# Patient Record
Sex: Female | Born: 1985
Health system: Southern US, Community
[De-identification: ages and names within clinical notes are randomized; demographics above are authoritative.]

## PROBLEM LIST (undated history)

## (undated) DIAGNOSIS — J4520 Mild intermittent asthma, uncomplicated: Secondary | ICD-10-CM

## (undated) DIAGNOSIS — J302 Other seasonal allergic rhinitis: Secondary | ICD-10-CM

## (undated) DIAGNOSIS — N281 Cyst of kidney, acquired: Secondary | ICD-10-CM

## (undated) DIAGNOSIS — Z9889 Other specified postprocedural states: Secondary | ICD-10-CM

## (undated) HISTORY — PX: WISDOM TOOTH EXTRACTION: SHX21

## (undated) HISTORY — DX: Other seasonal allergic rhinitis: J30.2

## (undated) HISTORY — DX: Mild intermittent asthma, uncomplicated: J45.20

---

## 2018-03-21 ENCOUNTER — Ambulatory Visit (INDEPENDENT_AMBULATORY_CARE_PROVIDER_SITE_OTHER): Payer: Self-pay | Admitting: Family Medicine

## 2018-03-21 ENCOUNTER — Ambulatory Visit (INDEPENDENT_AMBULATORY_CARE_PROVIDER_SITE_OTHER): Payer: Self-pay

## 2018-03-21 VITALS — BP 105/70 | HR 84 | Temp 98.4°F | Resp 18 | Ht 63.5 in | Wt 197.0 lb

## 2018-03-21 DIAGNOSIS — Z021 Encounter for pre-employment examination: Secondary | ICD-10-CM

## 2018-03-21 DIAGNOSIS — Z111 Encounter for screening for respiratory tuberculosis: Secondary | ICD-10-CM

## 2018-03-21 NOTE — Patient Instructions (Signed)

## 2018-03-21 NOTE — Progress Notes (Signed)
Danielle Olsen is a 32 y.o. female who presents today with concerns of need for a physical exam for pre-employment. 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 She confirms asthma as her only chronic health condition that is well controlled and she is under the care of a pulmonologist. She is a married mother of a 33 year old daughter.  Review of Systems  Constitutional: Negative for chills, fever and malaise/fatigue.  HENT: Negative for congestion, ear discharge, ear pain, sinus pain and sore throat.   Eyes: Negative.   Respiratory: Negative for cough, sputum production and shortness of breath.   Cardiovascular: Negative.  Negative for chest pain.  Gastrointestinal: Negative for abdominal pain, diarrhea, nausea and vomiting.  Genitourinary: Negative for dysuria, frequency, hematuria and urgency.  Musculoskeletal: Negative for myalgias.  Skin: Negative.   Neurological: Negative for headaches.  Endo/Heme/Allergies: Negative.   Psychiatric/Behavioral: Negative.     O: Vitals:   03/21/18 0834  BP: 105/70  Pulse: 84  Resp: 18  Temp: 98.4 F (36.9 C)  SpO2: 97%     Physical Exam  Constitutional: She is oriented to person, place, and time. Vital signs are normal. She appears well-developed and well-nourished. She is active.  Non-toxic appearance. She does not have a sickly appearance.  HENT:  Head: Normocephalic.  Right Ear: Hearing, tympanic membrane, external ear and ear canal normal.  Left Ear: Hearing, tympanic membrane, external ear and ear canal normal.  Nose: Nose normal.  Mouth/Throat: Uvula is midline and oropharynx is clear and moist.  Neck: Normal range of motion. Neck supple.  Cardiovascular: Normal rate, regular rhythm, normal heart sounds and normal pulses.  Pulmonary/Chest: Effort normal and breath sounds normal.  Abdominal: Soft. Bowel sounds are normal.  Musculoskeletal: Normal range of motion.  Lymphadenopathy:       Head  (right side): No submental and no submandibular adenopathy present.       Head (left side): No submental and no submandibular adenopathy present.    She has no cervical adenopathy.  Neurological: She is alert and oriented to person, place, and time.  Psychiatric: She has a normal mood and affect.  Vitals reviewed.   A: 1. Physical exam, pre-employment     P: Discussed exam findings, diagnosis etiology and medication use and indications reviewed with patient. Follow- Up and discharge instructions provided. No emergent/urgent issues found on exam.  Patient verbalized understanding of information provided and agrees with plan of care (POC), all questions answered.  1. Physical exam, pre-employment No acute findings- Eye exam in the last year. Exam WNL

## 2018-03-21 NOTE — Progress Notes (Signed)
Patient presents for PPD placement for work. Denies previous positive TB test  Denies known exposure to TB   Tuberculin skin test applied to left ventral forearm.  Patient informed to return to [facility] in 48-72 hours for PPD read. Vaccine Information Statement provided to patient.   

## 2018-03-23 LAB — TB SKIN TEST
Induration: 0 mm
TB SKIN TEST: NEGATIVE

## 2018-04-25 MED FILL — MONTELUKAST SOD 10 MG TAB: 10 | 30 days supply | Qty: 30 | Fill #0

## 2018-05-04 MED FILL — ARNUITY ELLIPTA 100 MCG INH: 100 | 60 days supply | Qty: 60 | Fill #0

## 2018-05-25 MED FILL — MONTELUKAST SOD 10 MG TAB: 10 | 30 days supply | Qty: 30 | Fill #1

## 2018-06-01 ENCOUNTER — Ambulatory Visit (INDEPENDENT_AMBULATORY_CARE_PROVIDER_SITE_OTHER): Payer: Self-pay | Admitting: Nurse Practitioner

## 2018-06-01 DIAGNOSIS — Z23 Encounter for immunization: Secondary | ICD-10-CM

## 2018-06-01 NOTE — Progress Notes (Signed)
Pt presents here today for visit to receive Inflenza(left deltoid) vaccine. Allergies reviewed, vaccine given, vaccine information statement provided, tolerated well.

## 2018-06-21 MED FILL — MONTELUKAST SOD 10 MG TAB: 10 | 30 days supply | Qty: 30 | Fill #2

## 2018-07-21 MED FILL — MONTELUKAST SOD 10 MG TAB: 10 | 30 days supply | Qty: 30 | Fill #3

## 2018-09-05 DIAGNOSIS — Z1389 Encounter for screening for other disorder: Secondary | ICD-10-CM | POA: Diagnosis not present

## 2018-09-05 DIAGNOSIS — Z124 Encounter for screening for malignant neoplasm of cervix: Secondary | ICD-10-CM | POA: Diagnosis not present

## 2018-09-05 DIAGNOSIS — Z6834 Body mass index (BMI) 34.0-34.9, adult: Secondary | ICD-10-CM | POA: Diagnosis not present

## 2018-09-05 DIAGNOSIS — R87610 Atypical squamous cells of undetermined significance on cytologic smear of cervix (ASC-US): Secondary | ICD-10-CM | POA: Diagnosis not present

## 2018-09-05 DIAGNOSIS — Z130 Encounter for screening for diseases of the blood and blood-forming organs and certain disorders involving the immune mechanism: Secondary | ICD-10-CM | POA: Diagnosis not present

## 2018-09-05 DIAGNOSIS — Z01419 Encounter for gynecological examination (general) (routine) without abnormal findings: Secondary | ICD-10-CM | POA: Diagnosis not present

## 2018-10-21 MED FILL — MONTELUKAST SOD 10 MG TAB: 10 | 30 days supply | Qty: 30 | Fill #4

## 2018-11-09 MED FILL — MONTELUKAST SOD 10 MG TAB: 10 | 90 days supply | Qty: 90 | Fill #5

## 2019-02-06 MED FILL — MONTELUKAST SOD 10 MG TAB: 10 | 90 days supply | Qty: 90 | Fill #6

## 2019-03-16 DIAGNOSIS — Z319 Encounter for procreative management, unspecified: Secondary | ICD-10-CM | POA: Diagnosis not present

## 2019-04-07 DIAGNOSIS — N979 Female infertility, unspecified: Secondary | ICD-10-CM | POA: Diagnosis not present

## 2019-04-07 DIAGNOSIS — Z6833 Body mass index (BMI) 33.0-33.9, adult: Secondary | ICD-10-CM | POA: Diagnosis not present

## 2019-05-10 MED FILL — MONTELUKAST SOD 10 MG TAB: 10 | 90 days supply | Qty: 90 | Fill #0

## 2019-05-16 ENCOUNTER — Encounter: Payer: Self-pay | Admitting: Internal Medicine

## 2019-05-16 NOTE — Patient Instructions (Addendum)
Flu shot Singulair, allegra F/u in 1 year or sooner if feeling unwell

## 2019-05-16 NOTE — Progress Notes (Signed)
Synopsis: self referral mild intermittent asthma  Subjective:   PATIENT ID: Danielle Olsen GENDER: female DOB: 1986/03/10, MRN: [id number]  Chief Complaint  Patient presents with  . Consult    Consult for Asthma.     HPI 33 year old woman here to establish care for her mild intermittent asthma. Home regimen includes singulair and PRN pulmicort Highly active, multiple aerobic workouts per week Hospitalized once in third grade No recent ER visits/hospitalizations Triggered by hot to cold seasonal transitions No flu shot yet Patient in first trimester, last pregnancy asthma control improved while gravid  Past Medical History:  Diagnosis Date  . Mild intermittent asthma   . Seasonal allergies      Family History  Problem Relation Age of Onset  . Lung cancer Father   . COPD Mother        Social History   Socioeconomic History  . Marital status: Married    Spouse name: Leory Plowman  . Number of children: 1  . Years of education: Not on file  . Highest education level: Master's degree (e.g., MA, MS, MEng, MEd, MSW, MBA)  Occupational History  . Not on file  Social Needs  . Financial resource strain: Not on file  . Food insecurity    Worry: Not on file    Inability: Not on file  . Transportation needs    Medical: No    Non-medical: No  Tobacco Use  . Smoking status: Never Smoker  . Smokeless tobacco: Never Used  Substance and Sexual Activity  . Alcohol use: Not Currently  . Drug use: Never  . Sexual activity: Not on file  Lifestyle  . Physical activity    Days per week: 7 days    Minutes per session: 40 min  . Stress: Not on file  Relationships  . Social Herbalist on phone: Not on file    Gets together: Not on file    Attends religious service: Not on file    Active member of club or organization: Not on file    Attends meetings of clubs or organizations: Not on file    Relationship status: Married  . Intimate partner violence    Fear of  current or ex partner: Not on file    Emotionally abused: Not on file    Physically abused: Not on file    Forced sexual activity: Not on file  Other Topics Concern  . Not on file  Social History Narrative  . Not on file     Allergies  Allergen Reactions  . Penicillins      Outpatient Medications Prior to Visit  Medication Sig Dispense Refill  . budesonide (PULMICORT) 0.25 MG/2ML nebulizer solution Take 0.25 mg by nebulization as needed.     . fexofenadine (ALLEGRA) 30 MG/5ML suspension Take 30 mg by mouth daily.    . montelukast (SINGULAIR) 10 MG tablet Take 10 mg by mouth at bedtime.     No facility-administered medications prior to visit.      Positive Symptoms in bold:  Constitutional fevers, chills, weight loss, fatigue, anorexia, malaise  Eyes decreased vision, double vision, eye irritation  Ears, Nose, Mouth, Throat sore throat, trouble swallowing, sinus congestion  Cardiovascular chest pain, paroxysmal nocturnal dyspnea, lower ext edema, palpitations   Respiratory SOB, cough, DOE, hemoptysis, wheezing  Gastrointestinal nausea, vomiting, diarrhea  Genitourinary burning with urination, trouble urinating  Musculoskeletal joint aches, joint swelling, back pain  Integumentary  rashes, skin lesions  Neurological focal  weakness, focal numbness, trouble speaking, headaches  Psychiatric depression, anxiety, confusion  Endocrine polyuria, polydipsia, cold intolerance, heat intolerance  Hematologic abnormal bruising, abnormal bleeding, unexplained nose bleeds  Allergic/Immunologic recurrent infections, hives, swollen lymph nodes    Objective:  GEN: young woman in NAD HEENT: mask in place, trachea midline CV: RRR, ext warm PULM: clear, no accessory muscle use GI: Soft, +BS EXT: No edema NEURO: Ambulates normally PSYCH: AOx3, good insight SKIN: No rashes    Vitals:   05/19/19 0914  BP: 122/84  Pulse: 82  Temp: 98.7 F (37.1 C)  TempSrc: Temporal  SpO2: 100%   Weight: 193 lb 6.4 oz (87.7 kg)  Height: 5\' 3"  (1.6 m)   100% on RA BMI Readings from Last 3 Encounters:  05/19/19 34.26 kg/m  03/21/18 34.35 kg/m   Wt Readings from Last 3 Encounters:  05/19/19 193 lb 6.4 oz (87.7 kg)  03/21/18 197 lb (89.4 kg)      Assessment & Plan:  # Mild intermittent asthma # Health maintenance # 1st trimester  Discussion: Continue singulair and PRN symbicort Flu shot today She will call if any issues otherwise f/u 1 year   Current Outpatient Medications:  .  budesonide (PULMICORT) 0.25 MG/2ML nebulizer solution, Take 0.25 mg by nebulization as needed. , Disp: , Rfl:  .  fexofenadine (ALLEGRA) 30 MG/5ML suspension, Take 30 mg by mouth daily., Disp: , Rfl:  .  montelukast (SINGULAIR) 10 MG tablet, Take 1 tablet (10 mg total) by mouth at bedtime., Disp: 90 tablet, Rfl: Bradley, MD [facility] 05/19/2019 10:00 AM

## 2019-05-19 ENCOUNTER — Other Ambulatory Visit: Payer: Self-pay

## 2019-05-19 ENCOUNTER — Ambulatory Visit: Payer: 59 | Admitting: Internal Medicine

## 2019-05-19 ENCOUNTER — Encounter: Payer: Self-pay | Admitting: Internal Medicine

## 2019-05-19 VITALS — BP 122/84 | HR 82 | Temp 98.7°F | Ht 63.0 in | Wt 193.4 lb

## 2019-05-19 DIAGNOSIS — Z23 Encounter for immunization: Secondary | ICD-10-CM | POA: Diagnosis not present

## 2019-05-19 DIAGNOSIS — Z0000 Encounter for general adult medical examination without abnormal findings: Secondary | ICD-10-CM | POA: Diagnosis not present

## 2019-05-19 DIAGNOSIS — J4520 Mild intermittent asthma, uncomplicated: Secondary | ICD-10-CM | POA: Diagnosis not present

## 2019-05-19 MED ORDER — MONTELUKAST SODIUM 10 MG PO TABS: 10.0000 mg | ORAL_TABLET | Freq: Every day | ORAL | 4 refills | Status: DC

## 2019-05-22 DIAGNOSIS — Z3201 Encounter for pregnancy test, result positive: Secondary | ICD-10-CM | POA: Diagnosis not present

## 2019-05-22 DIAGNOSIS — N911 Secondary amenorrhea: Secondary | ICD-10-CM | POA: Diagnosis not present

## 2019-05-22 DIAGNOSIS — Z23 Encounter for immunization: Secondary | ICD-10-CM | POA: Diagnosis not present

## 2019-06-09 DIAGNOSIS — Z113 Encounter for screening for infections with a predominantly sexual mode of transmission: Secondary | ICD-10-CM | POA: Diagnosis not present

## 2019-06-09 DIAGNOSIS — Z3689 Encounter for other specified antenatal screening: Secondary | ICD-10-CM | POA: Diagnosis not present

## 2019-06-09 DIAGNOSIS — Z3A09 9 weeks gestation of pregnancy: Secondary | ICD-10-CM | POA: Diagnosis not present

## 2019-06-09 DIAGNOSIS — O26891 Other specified pregnancy related conditions, first trimester: Secondary | ICD-10-CM | POA: Diagnosis not present

## 2019-06-09 LAB — OB RESULTS CONSOLE HEPATITIS B SURFACE ANTIGEN: Hepatitis B Surface Ag: NEGATIVE

## 2019-06-09 LAB — OB RESULTS CONSOLE GC/CHLAMYDIA
Chlamydia: NEGATIVE
Gonorrhea: NEGATIVE

## 2019-06-09 LAB — OB RESULTS CONSOLE HIV ANTIBODY (ROUTINE TESTING): HIV: NONREACTIVE

## 2019-06-09 LAB — OB RESULTS CONSOLE ABO/RH: RH Type: POSITIVE

## 2019-06-09 LAB — OB RESULTS CONSOLE RUBELLA ANTIBODY, IGM: Rubella: IMMUNE

## 2019-06-09 LAB — OB RESULTS CONSOLE RPR: RPR: NONREACTIVE

## 2019-06-09 LAB — OB RESULTS CONSOLE ANTIBODY SCREEN: Antibody Screen: NEGATIVE

## 2019-06-28 DIAGNOSIS — O200 Threatened abortion: Secondary | ICD-10-CM | POA: Diagnosis not present

## 2019-06-28 DIAGNOSIS — Z3A12 12 weeks gestation of pregnancy: Secondary | ICD-10-CM | POA: Diagnosis not present

## 2019-08-02 MED FILL — MONTELUKAST SOD 10 MG TAB: 10 | 90 days supply | Qty: 90 | Fill #0

## 2019-08-14 DIAGNOSIS — Z363 Encounter for antenatal screening for malformations: Secondary | ICD-10-CM | POA: Diagnosis not present

## 2019-08-14 DIAGNOSIS — Z3A18 18 weeks gestation of pregnancy: Secondary | ICD-10-CM | POA: Diagnosis not present

## 2019-10-09 DIAGNOSIS — Z3689 Encounter for other specified antenatal screening: Secondary | ICD-10-CM | POA: Diagnosis not present

## 2019-10-24 DIAGNOSIS — R9389 Abnormal findings on diagnostic imaging of other specified body structures: Secondary | ICD-10-CM | POA: Diagnosis not present

## 2019-10-24 DIAGNOSIS — Z3A29 29 weeks gestation of pregnancy: Secondary | ICD-10-CM | POA: Diagnosis not present

## 2019-10-24 DIAGNOSIS — O3429 Maternal care due to uterine scar from other previous surgery: Secondary | ICD-10-CM | POA: Diagnosis not present

## 2019-10-24 DIAGNOSIS — J45909 Unspecified asthma, uncomplicated: Secondary | ICD-10-CM | POA: Diagnosis not present

## 2019-10-24 DIAGNOSIS — O99213 Obesity complicating pregnancy, third trimester: Secondary | ICD-10-CM | POA: Diagnosis not present

## 2019-10-24 DIAGNOSIS — Z23 Encounter for immunization: Secondary | ICD-10-CM | POA: Diagnosis not present

## 2019-10-24 DIAGNOSIS — N83202 Unspecified ovarian cyst, left side: Secondary | ICD-10-CM | POA: Diagnosis not present

## 2019-12-18 ENCOUNTER — Encounter (HOSPITAL_COMMUNITY): Payer: Self-pay

## 2019-12-18 NOTE — Patient Instructions (Signed)
Danielle Olsen  12/18/2019   Your procedure is scheduled on:  01/03/2020  Arrive at [place] at Entrance C on [address] at [facility]  and [employer]. You are invited to use the FREE valet parking or use the Visitor's parking deck.  Pick up the phone at the desk and dial [phone].  Call this number if you have problems the morning of surgery: [phone]  Remember:   Do not eat food:(After Midnight) Desps de medianoche.  Do not drink clear liquids: (After Midnight) Desps de medianoche.  Take these medicines the morning of surgery with A SIP OF WATER:  none   Do not wear jewelry, make-up or nail polish.  Do not wear lotions, powders, or perfumes. Do not wear deodorant.  Do not shave 48 hours prior to surgery.  Do not bring valuables to the hospital.  [facility] is not   responsible for any belongings or valuables brought to the hospital.  Contacts, dentures or bridgework may not be worn into surgery.  Leave suitcase in the car. After surgery it may be brought to your room.  For patients admitted to the hospital, checkout time is 11:00 AM the day of              discharge.      Please read over the following fact sheets that you were given:     Preparing for Surgery

## 2019-12-19 ENCOUNTER — Encounter (HOSPITAL_COMMUNITY): Payer: Self-pay

## 2019-12-21 DIAGNOSIS — Z3685 Encounter for antenatal screening for Streptococcus B: Secondary | ICD-10-CM | POA: Diagnosis not present

## 2019-12-27 DIAGNOSIS — O99213 Obesity complicating pregnancy, third trimester: Secondary | ICD-10-CM | POA: Diagnosis not present

## 2019-12-27 DIAGNOSIS — O365931 Maternal care for other known or suspected poor fetal growth, third trimester, fetus 1: Secondary | ICD-10-CM | POA: Diagnosis not present

## 2019-12-27 DIAGNOSIS — Z3A38 38 weeks gestation of pregnancy: Secondary | ICD-10-CM | POA: Diagnosis not present

## 2019-12-27 DIAGNOSIS — O4103X0 Oligohydramnios, third trimester, not applicable or unspecified: Secondary | ICD-10-CM | POA: Diagnosis not present

## 2019-12-28 ENCOUNTER — Other Ambulatory Visit: Payer: Self-pay

## 2019-12-28 ENCOUNTER — Other Ambulatory Visit (HOSPITAL_COMMUNITY)
Admission: RE | Admit: 2019-12-28 | Discharge: 2019-12-28 | Disposition: A | Discharge status: Home / Self Care | Payer: 59 | Source: Ambulatory Visit | Attending: Obstetrics and Gynecology | Admitting: Obstetrics and Gynecology

## 2019-12-28 ENCOUNTER — Encounter (HOSPITAL_COMMUNITY): Payer: Self-pay | Admitting: Obstetrics and Gynecology

## 2019-12-28 LAB — CBC
HCT: 33.5 % — ABNORMAL LOW (ref 36.0–46.0)
Hemoglobin: 10.8 g/dL — ABNORMAL LOW (ref 12.0–15.0)
MCH: 26.7 pg (ref 26.0–34.0)
MCHC: 32.2 g/dL (ref 30.0–36.0)
MCV: 82.9 fL (ref 80.0–100.0)
Platelets: 225 10*3/uL (ref 150–400)
RBC: 4.04 MIL/uL (ref 3.87–5.11)
RDW: 14.7 % (ref 11.5–15.5)
WBC: 9.5 10*3/uL (ref 4.0–10.5)
nRBC: 0 % (ref 0.0–0.2)

## 2019-12-28 LAB — SARS CORONAVIRUS 2 (TAT 6-24 HRS): SARS Coronavirus 2: NEGATIVE

## 2019-12-28 LAB — TYPE AND SCREEN
ABO/RH(D): A POS
Antibody Screen: NEGATIVE

## 2019-12-28 LAB — ABO/RH: ABO/RH(D): A POS

## 2019-12-28 NOTE — H&P (Deleted)
  The note originally documented on this encounter has been moved the the encounter in which it belongs.  

## 2019-12-28 NOTE — H&P (Signed)
Danielle Olsen is a 34 y.o. female G2 P1001 at 73 3/7 weeks (EDD 01/09/20 by LMP c/w 9 week [place])  presenting for scheduled repeat c-section given recent US demonstrating oligohydramnios with no 2cm X 2cm pocket of fluid identifiable.  Normal growth and fetal surveillance otherwise. Prenatal care significant for: 1) Oligohydramnios as above   2) Maternal obesity   3) Isolated EIF-- declined Panorama, 4) Asthma--Singulair QD, PRN ProAIr, follows Pulm  5) Prior  LTCS for NRFHR, declinesTOLAC  6) Cyst of left ovary-6 cm septated cyst stable     throughout pregnancy  OB History    Gravida  2   Para  1   Term      Preterm      AB      Living        SAB      TAB      Ectopic      Multiple      Live Births            06-05-2014, 40.4 wks  M, 6lbs 15oz, Cesarean Section  Past Medical History:  Diagnosis Date  . Mild intermittent asthma   . Seasonal allergies    Past Surgical History:  Procedure Laterality Date  . CESAREAN SECTION     Family History: family history includes COPD in her mother; Lung cancer in her father. Social History:  reports that she has never smoked. She has never used smokeless tobacco. She reports previous alcohol use. She reports that she does not use drugs.     Maternal Diabetes: No Genetic Screening: Declined Maternal Ultrasounds/Referrals: Isolated EIF (echogenic intracardiac focus) Fetal Ultrasounds or other Referrals:  None Maternal Substance Abuse:  No Significant Maternal Medications:  Meds include: Other: Singulair Significant Maternal Lab Results:  None Other Comments:  None  Review of Systems  Constitutional: Negative for fever.  Gastrointestinal: Negative for abdominal pain.   Maternal Medical History:  Contractions: Frequency: irregular.   Perceived severity is mild.    Fetal activity: Perceived fetal activity is normal.    Prenatal complications: Oligohydramnios.   Prenatal Complications - Diabetes: none.       There were no vitals taken for this visit. Maternal Exam:  Uterine Assessment: Contraction strength is mild.  Contraction frequency is rare.   Abdomen: Patient reports no abdominal tenderness. Surgical scars: low transverse.   Fetal presentation: vertex  Introitus: Normal vulva. Normal vagina.    Physical Exam  Constitutional: She appears well-developed.  Cardiovascular: Normal rate and regular rhythm.  Respiratory: Effort normal.  GI: Soft.  Genitourinary:    Vulva normal.   Musculoskeletal:        General: Normal range of motion.  Neurological: She is alert.  Psychiatric: She has a normal mood and affect.    Prenatal labs: ABO, Rh: --/--/A POS, A POS Performed at [facility], [place] [address] [place], [place] [address]  [phone]/20 1141) Antibody: NEG (05/20 1141) Rubella: Immune (10/30 0000) RPR: Nonreactive (10/30 0000)  HBsAg: Negative (10/30 0000)  HIV: Non-reactive (10/30 0000)  GBS:   Negative HgbAA One hour GCT 104  Assessment/Plan: Given her severe oligohydramnios, we discussed the safest option is delivery   We discussed c-section procedure in detail with risks of bleeding, infection and possible damage to bowel and bladder.  We discussed ovarian cystectomy for her 6cm cyst but that if no normal ovarian tissue remains she may require complete removal of the left ovary.  PT is agreeable to proceed.  [name] 12/28/2019, 10:19 PM

## 2019-12-28 NOTE — Patient Instructions (Signed)
Danielle Olsen  12/28/2019   Your procedure is scheduled on:  12/29/2019  Arrive at [place] at Entrance C on [address] at [facility]  and [employer]. You are invited to use the FREE valet parking or use the Visitor's parking deck.  Pick up the phone at the desk and dial [phone].  Call this number if you have problems the morning of surgery: [phone]  Remember:   Do not eat food:(After Midnight) Desps de medianoche.  Do not drink clear liquids: (After Midnight) Desps de medianoche.  Take these medicines the morning of surgery with A SIP OF WATER:  none   Do not wear jewelry, make-up or nail polish.  Do not wear lotions, powders, or perfumes. Do not wear deodorant.  Do not shave 48 hours prior to surgery.  Do not bring valuables to the hospital.  [facility] is not   responsible for any belongings or valuables brought to the hospital.  Contacts, dentures or bridgework may not be worn into surgery.  Leave suitcase in the car. After surgery it may be brought to your room.  For patients admitted to the hospital, checkout time is 11:00 AM the day of              discharge.      Please read over the following fact sheets that you were given:     Preparing for Surgery

## 2019-12-28 NOTE — Anesthesia Preprocedure Evaluation (Addendum)
Anesthesia Evaluation  Patient identified by MRN, date of birth, ID band Patient awake    Reviewed: Allergy & Precautions, NPO status , Patient's Chart, lab work & pertinent test results  Airway Mallampati: II  TM Distance: >3 FB Neck ROM: Full    Dental no notable dental hx. (+) Teeth Intact   Pulmonary    Pulmonary exam normal breath sounds clear to auscultation       Cardiovascular negative cardio ROS Normal cardiovascular exam Rhythm:Regular Rate:Normal     Neuro/Psych negative neurological ROS  negative psych ROS   GI/Hepatic negative GI ROS, Neg liver ROS,   Endo/Other  negative endocrine ROS  Renal/GU      Musculoskeletal   Abdominal (+) + obese,   Peds  Hematology Hgb 10.8 Plt 225 T&S Apos   Anesthesia Other Findings   Reproductive/Obstetrics (+) Pregnancy                           Anesthesia Physical Anesthesia Plan  ASA: III  Anesthesia Plan: Spinal   Post-op Pain Management:    Induction: Intravenous  PONV Risk Score and Plan: 4 or greater and Treatment may vary due to age or medical condition  Airway Management Planned: Nasal Cannula and Natural Airway  Additional Equipment: None  Intra-op Plan:   Post-operative Plan:   Informed Consent: I have reviewed the patients History and Physical, chart, labs and discussed the procedure including the risks, benefits and alternatives for the proposed anesthesia with the patient or authorized representative who has indicated his/her understanding and acceptance.       Plan Discussed with: CRNA  Anesthesia Plan Comments: ( 38 4/7 wk G@P ! For Repeat C/S under Spinal T 7 S available)       Anesthesia Quick Evaluation

## 2019-12-29 ENCOUNTER — Inpatient Hospital Stay (HOSPITAL_COMMUNITY): Payer: 59 | Admitting: Anesthesiology

## 2019-12-29 ENCOUNTER — Other Ambulatory Visit: Payer: Self-pay

## 2019-12-29 ENCOUNTER — Encounter (HOSPITAL_COMMUNITY)
Admission: RE | Disposition: A | Discharge status: Home / Self Care | Payer: Self-pay | Source: Home / Self Care | Attending: Obstetrics and Gynecology

## 2019-12-29 ENCOUNTER — Inpatient Hospital Stay (HOSPITAL_COMMUNITY): Admission: RE | Admit: 2019-12-29 | Payer: 59 | Source: Home / Self Care | Admitting: Obstetrics and Gynecology

## 2019-12-29 ENCOUNTER — Encounter (HOSPITAL_COMMUNITY): Payer: Self-pay | Admitting: Obstetrics and Gynecology

## 2019-12-29 ENCOUNTER — Inpatient Hospital Stay (HOSPITAL_COMMUNITY)
Admission: RE | Admit: 2019-12-29 | Discharge: 2019-12-31 | DRG: 787 | Disposition: A | Discharge status: Home / Self Care | Payer: 59 | Attending: Obstetrics and Gynecology | Admitting: Obstetrics and Gynecology

## 2019-12-29 DIAGNOSIS — N83202 Unspecified ovarian cyst, left side: Secondary | ICD-10-CM | POA: Diagnosis present

## 2019-12-29 DIAGNOSIS — O99214 Obesity complicating childbirth: Secondary | ICD-10-CM | POA: Diagnosis present

## 2019-12-29 DIAGNOSIS — O3483 Maternal care for other abnormalities of pelvic organs, third trimester: Secondary | ICD-10-CM | POA: Diagnosis present

## 2019-12-29 DIAGNOSIS — E669 Obesity, unspecified: Secondary | ICD-10-CM | POA: Diagnosis present

## 2019-12-29 DIAGNOSIS — Z8759 Personal history of other complications of pregnancy, childbirth and the puerperium: Secondary | ICD-10-CM | POA: Diagnosis not present

## 2019-12-29 DIAGNOSIS — O9952 Diseases of the respiratory system complicating childbirth: Secondary | ICD-10-CM | POA: Diagnosis present

## 2019-12-29 DIAGNOSIS — D271 Benign neoplasm of left ovary: Secondary | ICD-10-CM | POA: Diagnosis not present

## 2019-12-29 DIAGNOSIS — O34211 Maternal care for low transverse scar from previous cesarean delivery: Principal | ICD-10-CM | POA: Diagnosis present

## 2019-12-29 DIAGNOSIS — O4103X0 Oligohydramnios, third trimester, not applicable or unspecified: Secondary | ICD-10-CM | POA: Diagnosis present

## 2019-12-29 DIAGNOSIS — Z98891 History of uterine scar from previous surgery: Secondary | ICD-10-CM | POA: Diagnosis not present

## 2019-12-29 DIAGNOSIS — Z3A38 38 weeks gestation of pregnancy: Secondary | ICD-10-CM

## 2019-12-29 DIAGNOSIS — Z20822 Contact with and (suspected) exposure to covid-19: Secondary | ICD-10-CM | POA: Diagnosis present

## 2019-12-29 DIAGNOSIS — J4520 Mild intermittent asthma, uncomplicated: Secondary | ICD-10-CM | POA: Diagnosis present

## 2019-12-29 LAB — RPR: RPR Ser Ql: NONREACTIVE

## 2019-12-29 SURGERY — Surgical Case
Anesthesia: Spinal

## 2019-12-29 MED ORDER — WITCH HAZEL-GLYCERIN EX PADS: 1.0000 "application " | MEDICATED_PAD | CUTANEOUS | Status: DC | PRN

## 2019-12-29 MED ORDER — OXYCODONE HCL 5 MG PO TABS: 5.0000 mg | ORAL_TABLET | Freq: Once | ORAL | Status: DC | PRN

## 2019-12-29 MED ORDER — LACTATED RINGERS IV SOLN
INTRAVENOUS | Status: DC | PRN
Start: 2019-12-29 — End: 2019-12-29

## 2019-12-29 MED ORDER — DIBUCAINE (PERIANAL) 1 % EX OINT: 1.0000 "application " | TOPICAL_OINTMENT | CUTANEOUS | Status: DC | PRN

## 2019-12-29 MED ORDER — IBUPROFEN 800 MG PO TABS
800.0000 mg | ORAL_TABLET | Freq: Three times a day (TID) | ORAL | Status: DC
  Administered 2019-12-29 – 2019-12-31 (×5): 800 mg via ORAL
  Filled 2019-12-29 (×6): qty 1

## 2019-12-29 MED ORDER — CLINDAMYCIN PHOSPHATE 900 MG/50ML IV SOLN: 900.0000 mg | INTRAVENOUS | Status: DC

## 2019-12-29 MED ORDER — COCONUT OIL OIL
1.0000 "application " | TOPICAL_OIL | Status: DC | PRN
  Administered 2019-12-30: 1 via TOPICAL

## 2019-12-29 MED ORDER — LACTATED RINGERS IV SOLN: INTRAVENOUS | Status: DC

## 2019-12-29 MED ORDER — MORPHINE SULFATE (PF) 0.5 MG/ML IJ SOLN
INTRAMUSCULAR | Status: DC | PRN
  Administered 2019-12-29: 150 ug via INTRATHECAL

## 2019-12-29 MED ORDER — MENTHOL 3 MG MT LOZG: 1.0000 | LOZENGE | OROMUCOSAL | Status: DC | PRN

## 2019-12-29 MED ORDER — SIMETHICONE 80 MG PO CHEW
80.0000 mg | CHEWABLE_TABLET | Freq: Three times a day (TID) | ORAL | Status: DC
  Administered 2019-12-29 – 2019-12-31 (×7): 80 mg via ORAL
  Filled 2019-12-29 (×7): qty 1

## 2019-12-29 MED ORDER — OXYTOCIN 10 UNIT/ML IJ SOLN
INTRAMUSCULAR | Status: DC | PRN
  Administered 2019-12-29: 40 [IU] via INTRAMUSCULAR

## 2019-12-29 MED ORDER — PRENATAL MULTIVITAMIN CH
1.0000 | ORAL_TABLET | Freq: Every day | ORAL | Status: DC
  Administered 2019-12-29 – 2019-12-31 (×3): 1 via ORAL
  Filled 2019-12-29 (×3): qty 1

## 2019-12-29 MED ORDER — CLINDAMYCIN PHOSPHATE 900 MG/50ML IV SOLN
INTRAVENOUS | Status: AC
  Filled 2019-12-29: qty 50

## 2019-12-29 MED ORDER — PHENYLEPHRINE HCL-NACL 20-0.9 MG/250ML-% IV SOLN
INTRAVENOUS | Status: DC | PRN
  Administered 2019-12-29: 60 ug/min via INTRAVENOUS

## 2019-12-29 MED ORDER — ONDANSETRON HCL 4 MG/2ML IJ SOLN
INTRAMUSCULAR | Status: DC | PRN
  Administered 2019-12-29: 4 mg via INTRAVENOUS

## 2019-12-29 MED ORDER — BUPIVACAINE IN DEXTROSE 0.75-8.25 % IT SOLN
INTRATHECAL | Status: DC | PRN
  Administered 2019-12-29: 12 mg via INTRATHECAL

## 2019-12-29 MED ORDER — SODIUM CHLORIDE 0.9 % IV SOLN: INTRAVENOUS | Status: DC | PRN

## 2019-12-29 MED ORDER — SENNOSIDES-DOCUSATE SODIUM 8.6-50 MG PO TABS
2.0000 | ORAL_TABLET | ORAL | Status: DC
  Administered 2019-12-30 – 2019-12-31 (×2): 2 via ORAL
  Filled 2019-12-29 (×2): qty 2

## 2019-12-29 MED ORDER — KETOROLAC TROMETHAMINE 30 MG/ML IJ SOLN
30.0000 mg | Freq: Once | INTRAMUSCULAR | Status: AC | PRN
  Administered 2019-12-29: 30 mg via INTRAVENOUS

## 2019-12-29 MED ORDER — ZOLPIDEM TARTRATE 5 MG PO TABS: 5.0000 mg | ORAL_TABLET | Freq: Every evening | ORAL | Status: DC | PRN

## 2019-12-29 MED ORDER — OXYTOCIN 40 UNITS IN NORMAL SALINE INFUSION - SIMPLE MED
2.5000 [IU]/h | INTRAVENOUS | Status: AC
  Administered 2019-12-29: 2.5 [IU]/h via INTRAVENOUS

## 2019-12-29 MED ORDER — DIPHENHYDRAMINE HCL 25 MG PO CAPS: 25.0000 mg | ORAL_CAPSULE | Freq: Four times a day (QID) | ORAL | Status: DC | PRN

## 2019-12-29 MED ORDER — SIMETHICONE 80 MG PO CHEW
80.0000 mg | CHEWABLE_TABLET | ORAL | Status: DC
  Administered 2019-12-30 – 2019-12-31 (×2): 80 mg via ORAL
  Filled 2019-12-29 (×2): qty 1

## 2019-12-29 MED ORDER — ACETAMINOPHEN 500 MG PO TABS
1000.0000 mg | ORAL_TABLET | Freq: Four times a day (QID) | ORAL | Status: DC
  Administered 2019-12-29 – 2019-12-31 (×8): 1000 mg via ORAL
  Filled 2019-12-29 (×9): qty 2

## 2019-12-29 MED ORDER — SIMETHICONE 80 MG PO CHEW: 80.0000 mg | CHEWABLE_TABLET | ORAL | Status: DC | PRN

## 2019-12-29 MED ORDER — GENTAMICIN SULFATE 40 MG/ML IJ SOLN
INTRAVENOUS | Status: DC | PRN
  Administered 2019-12-29: 350 mg via INTRAVENOUS

## 2019-12-29 MED ORDER — FENTANYL CITRATE (PF) 100 MCG/2ML IJ SOLN
INTRAMUSCULAR | Status: DC | PRN
  Administered 2019-12-29: 15 ug via INTRATHECAL

## 2019-12-29 MED ORDER — ONDANSETRON HCL 4 MG/2ML IJ SOLN: 4.0000 mg | Freq: Once | INTRAMUSCULAR | Status: DC | PRN

## 2019-12-29 MED ORDER — ENOXAPARIN SODIUM 60 MG/0.6ML ~~LOC~~ SOLN
0.5000 mg/kg | SUBCUTANEOUS | Status: DC
  Administered 2019-12-29 – 2019-12-31 (×2): 50 mg via SUBCUTANEOUS
  Filled 2019-12-29 (×2): qty 0.6

## 2019-12-29 MED ORDER — OXYCODONE HCL 5 MG PO TABS: 5.0000 mg | ORAL_TABLET | ORAL | Status: DC | PRN

## 2019-12-29 MED ORDER — CLINDAMYCIN PHOSPHATE 900 MG/50ML IV SOLN
INTRAVENOUS | Status: DC | PRN
Start: 2019-12-29 — End: 2019-12-29
  Administered 2019-12-29: 900 mg via INTRAVENOUS

## 2019-12-29 MED ORDER — OXYCODONE HCL 5 MG/5ML PO SOLN: 5.0000 mg | Freq: Once | ORAL | Status: DC | PRN

## 2019-12-29 MED ORDER — TETANUS-DIPHTH-ACELL PERTUSSIS 5-2.5-18.5 LF-MCG/0.5 IM SUSP: 0.5000 mL | Freq: Once | INTRAMUSCULAR | Status: DC

## 2019-12-29 MED ORDER — GENTAMICIN SULFATE 40 MG/ML IJ SOLN
5.0000 mg/kg | INTRAVENOUS | Status: DC
  Filled 2019-12-29: qty 8.75

## 2019-12-29 MED ORDER — HYDROMORPHONE HCL 1 MG/ML IJ SOLN: 0.2500 mg | INTRAMUSCULAR | Status: DC | PRN

## 2019-12-29 SURGICAL SUPPLY — 38 items
BENZOIN TINCTURE PRP APPL 2/3 (GAUZE/BANDAGES/DRESSINGS) ×3 IMPLANT
CHLORAPREP W/TINT 26ML (MISCELLANEOUS) ×3 IMPLANT
CLAMP CORD UMBIL (MISCELLANEOUS) IMPLANT
CLOSURE WOUND 1/2 X4 (GAUZE/BANDAGES/DRESSINGS) ×1
CLOTH BEACON ORANGE TIMEOUT ST (SAFETY) ×3 IMPLANT
DRSG OPSITE POSTOP 4X10 (GAUZE/BANDAGES/DRESSINGS) ×3 IMPLANT
ELECT REM PT RETURN 9FT ADLT (ELECTROSURGICAL) ×3
ELECTRODE REM PT RTRN 9FT ADLT (ELECTROSURGICAL) ×1 IMPLANT
EXTRACTOR VACUUM KIWI (MISCELLANEOUS) IMPLANT
GLOVE BIO SURGEON STRL SZ 6.5 (GLOVE) ×2 IMPLANT
GLOVE BIO SURGEONS STRL SZ 6.5 (GLOVE) ×1
GLOVE BIOGEL PI IND STRL 7.0 (GLOVE) ×1 IMPLANT
GLOVE BIOGEL PI INDICATOR 7.0 (GLOVE) ×2
GOWN STRL REUS W/TWL LRG LVL3 (GOWN DISPOSABLE) ×6 IMPLANT
KIT ABG SYR 3ML LUER SLIP (SYRINGE) IMPLANT
NEEDLE HYPO 25X5/8 SAFETYGLIDE (NEEDLE) IMPLANT
NS IRRIG 1000ML POUR BTL (IV SOLUTION) ×3 IMPLANT
PACK C SECTION WH (CUSTOM PROCEDURE TRAY) ×3 IMPLANT
PAD OB MATERNITY 4.3X12.25 (PERSONAL CARE ITEMS) ×3 IMPLANT
PENCIL SMOKE EVAC W/HOLSTER (ELECTROSURGICAL) ×3 IMPLANT
RTRCTR C-SECT PINK 25CM LRG (MISCELLANEOUS) ×3 IMPLANT
SPONGE LAP 18X18 X RAY DECT (DISPOSABLE) ×3 IMPLANT
STRIP CLOSURE SKIN 1/2X4 (GAUZE/BANDAGES/DRESSINGS) ×2 IMPLANT
SUT CHROMIC 1 CTX 36 (SUTURE) ×6 IMPLANT
SUT PLAIN 0 NONE (SUTURE) IMPLANT
SUT PLAIN 2 0 XLH (SUTURE) ×3 IMPLANT
SUT VIC AB 0 CT1 27 (SUTURE) ×4
SUT VIC AB 0 CT1 27XBRD ANBCTR (SUTURE) ×2 IMPLANT
SUT VIC AB 2-0 CT1 27 (SUTURE) ×8
SUT VIC AB 2-0 CT1 TAPERPNT 27 (SUTURE) ×4 IMPLANT
SUT VIC AB 3-0 CT1 27 (SUTURE)
SUT VIC AB 3-0 CT1 TAPERPNT 27 (SUTURE) IMPLANT
SUT VIC AB 3-0 SH 27 (SUTURE) ×2
SUT VIC AB 3-0 SH 27X BRD (SUTURE) ×1 IMPLANT
SUT VIC AB 4-0 KS 27 (SUTURE) ×3 IMPLANT
TOWEL OR 17X24 6PK STRL BLUE (TOWEL DISPOSABLE) ×3 IMPLANT
TRAY FOLEY W/BAG SLVR 14FR LF (SET/KITS/TRAYS/PACK) ×3 IMPLANT
WATER STERILE IRR 1000ML POUR (IV SOLUTION) ×3 IMPLANT

## 2019-12-29 NOTE — Lactation Note (Signed)
This note was copied from a baby's chart. Lactation Consultation Note  Patient Name: Danielle Olsen [id number] Date: 12/29/2019 Reason for consult: Initial assessment;Early term 37-38.6wks;Other (Comment)(baby sleepy , placed STS and asked mom to call with feeding cues)  As LC entered the room mom using the hand pump with drops.  Dad changed a wet diaper and baby wet all over the crib (  Large wet ).  Baby placed STS and was not interested in feeding so baby placed in STS after mom  Massaged the drops of the colostrum on the baby's lips.  RN had given mom the hand pump and the #24 F appeared to be a good fit.  Mom has been hand expressing.  Per mom has a pump at home.  LC provided information ( LPT ) due to her baby being ET for potential feeding behaviors.  Also the [facility] pamphlet with phone numbers.  LC encouraged and recommended for mom to call with feeding cues for a feeding assessment.  LC also mentioned to mom the [facility] would have the RN bring her breast shells due to some areola edema when she felt like putting on a bra. In the mean time prepumping with a hand pump would work.    Maternal Data Has patient been taught Hand Expression?: Yes Does the patient have breastfeeding experience prior to this delivery?: Yes  Feeding Feeding Type: Breast Fed  LATCH Score Latch: Too sleepy or reluctant, no latch achieved, no sucking elicited.  Audible Swallowing: None  Type of Nipple: Everted at rest and after stimulation  Comfort (Breast/Nipple): Soft / non-tender  Hold (Positioning): Assistance needed to correctly position infant at breast and maintain latch.  LATCH Score: 5  Interventions Interventions: Breast feeding basics reviewed;Assisted with latch;Skin to skin;Pre-pump if needed;Adjust position;Support pillows;Position options  Lactation Tools Discussed/Used Tools: Pump Breast pump type: Manual WIC Program: No   Consult Status Consult Status: Follow-up Date:  12/29/19 Follow-up type: In-patient    [place] 12/29/2019, 3:30 PM

## 2019-12-29 NOTE — Anesthesia Postprocedure Evaluation (Signed)
Anesthesia Post Note  Patient: Danielle Olsen  Procedure(s) Performed: CESAREAN SECTION (N/A )     Patient location during evaluation: Mother Baby Anesthesia Type: Spinal Level of consciousness: oriented and awake and alert Pain management: pain level controlled Vital Signs Assessment: post-procedure vital signs reviewed and stable Respiratory status: spontaneous breathing and respiratory function stable Cardiovascular status: blood pressure returned to baseline and stable Postop Assessment: no headache, no backache, no apparent nausea or vomiting and able to ambulate Anesthetic complications: no    Last Vitals:  Vitals:   12/29/19 0915 12/29/19 0930  BP: 113/72 111/65  Pulse: 84 75  Resp: 18 18  Temp: 36.4 C   SpO2: 99% 99%    Last Pain:  Vitals:   12/29/19 0915  TempSrc: Oral   Pain Goal:    LLE Motor Response: Purposeful movement (12/29/19 0915) LLE Sensation: Tingling (12/29/19 0915) RLE Motor Response: Purposeful movement (12/29/19 0915) RLE Sensation: Tingling (12/29/19 0915)     Epidural/Spinal Function Cutaneous sensation: Tingles (12/29/19 0915), Patient able to flex knees: Yes (12/29/19 0915), Patient able to lift hips off bed: No (12/29/19 0915), Back pain beyond tenderness at insertion site: No (12/29/19 0915), Progressively worsening motor and/or sensory loss: No (12/29/19 0915), Bowel and/or bladder incontinence post epidural: No (12/29/19 0915)  Barnet Glasgow

## 2019-12-29 NOTE — Progress Notes (Signed)
Patient ID: Danielle Olsen, female   DOB: 1985/12/05, 34 y.o.   MRN: [id number] Per pt no changes in dictated H&P.  Good FM.  Brief exam WNL, ready to proceed.

## 2019-12-29 NOTE — Op Note (Signed)
Operative Note    Preoperative Diagnosis Term pregnancy at 38 3/7 weeks Oligohydramnios Prior c-section, declines TOL Left ovarian septated cyst  Postoperative Diagnosis same  Procedure Repeat low transverse c-section with 2 layer closure of uterus Left ovarian cystectomy  Surgeon Paula Compton, MD Duffy Rhody, RNFA  Anesthesia Spinal  Fluids: EBL 434mL UOP 141mL clear IVF 1876mL LR  Findings A viable female infant in vertex presentation.  Apgars 7,9 Weight pending  Left ovarian cyst about 6cm and few septations, shelled out and removed.  Right ovary and tube WNL, left tube WNL.   Specimen Placenta to L&D  Ovarian cyst wall to pathology  Procedure Note Patient was taken to the operating room where spinal anesthesia was obtained and found to be adequate by Allis clamp test. She was prepped and draped in the normal sterile fashion in the dorsal supine position with a leftward tilt. An appropriate time out was performed. A Pfannenstiel skin incision was then made through a pre-existing scar with the scalpel and carried through to the underlying layer of fascia by sharp dissection and Bovie cautery. The fascia was nicked in the midline and the incision was extended laterally with Mayo scissors. The inferior aspect of the incision was grasped Coker clamps and dissected off the underlying rectus muscles. In a similar fashion the superior aspect was dissected off the rectus muscles. Rectus muscles were separated in the midline and the peritoneal cavity entered bluntly.  There were omental adhesions taken don from the midline of the anterior abdominal wall.  The peritoneal incision was then extended both superiorly and inferiorly with careful attention to avoid both bowel and bladder. The Alexis self-retaining wound retractor was then placed within the incision and the lower uterine segment exposed. The bladder flap was developed with Metzenbaum scissors and pushed away from the  lower uterine segment. The lower uterine segment was then incised in a transverse fashion and the cavity itself entered bluntly. The incision was extended bluntly. The infant's head was then lifted and delivered from the incision without difficulty. The remainder of the infant delivered and the nose and mouth bulb suctioned with the cord clamped and cut as well. The infant was handed off to the waiting pediatricians. The placenta was then spontaneously expressed from the uterus and the uterus cleared of all clots and debris with moist lap sponge. The uterine incision was then repaired in 2 layers the first layer was a running locked layer of 1-0 chromic and the second an imbricating layer of the same suture. The tubes and ovaries were inspected and the gutters cleared of all clots and debris. The uterine incision was inspected and found to be hemostatic.  The left adnexa was elevated and inspected.  The cyst was shelled out and contained clear serous fluid. When down at the base of the cyst, the cyst wall was cross clamped and removed with suture ligature of 2-0 vicryl to secure.  The remaining ovary was closed with 3-o vicryl and excellent hemostasis noted.  The tube appeared normal.  All instruments and sponges as well as the Alexis retractor were then removed from the abdomen. The rectus muscles and peritoneum were then reapproximated with a running suture of 2-0 Vicryl. The fascia was then closed with 0 Vicryl in a running fashion. Subcutaneous tissue was reapproximated with 3-0 plain in a running fashion. The skin was closed with a subcuticular stitch of 4-0 Vicryl on a Keith needle and then reinforced with benzoin and Steri-Strips. At the conclusion of  the procedure all instruments and sponge counts were correct. Patient was taken to the recovery room in good condition with her baby accompanying her skin to skin.

## 2019-12-29 NOTE — Anesthesia Procedure Notes (Signed)
Spinal  Patient location during procedure: OB Start time: 12/29/2019 7:24 AM End time: 12/29/2019 7:29 AM Staffing Performed: anesthesiologist  Anesthesiologist: Barnet Glasgow, MD Preanesthetic Checklist Completed: patient identified, IV checked, risks and benefits discussed, surgical consent, monitors and equipment checked, pre-op evaluation and timeout performed Spinal Block Patient position: sitting Prep: DuraPrep and site prepped and draped Patient monitoring: heart rate, cardiac monitor, continuous pulse ox and blood pressure Approach: midline Location: L3-4 Injection technique: single-shot Needle Needle type: Pencan  Needle gauge: 24 G Needle length: 10 cm Needle insertion depth: 6 cm Assessment Sensory level: T4 Additional Notes 2 Attempt (s). Pt tolerated procedure well.

## 2019-12-29 NOTE — Lactation Note (Signed)
This note was copied from a baby's chart. Lactation Consultation Note  Patient Name: [name] [id number] Date: 12/29/2019  Telephone call from RN that mom wants LC to see a breastfeeding. Baby [name] now 25 hours old.  LC entered room and infant had fed on the right and mom trying to latch him onto the left breast.  Repositioned infant to be tummy to tummy with mom. Infant would open slightly .  Assist with doing some hand expression of drops in infants mouth to try and rouse him,e.  He would not wake to latch.  Left him STS with mom.  Urged to call lactation a needed.   Maternal Data    Feeding Feeding Type: Breast Milk  LATCH Score                   Interventions    Lactation Tools Discussed/Used     Consult Status      Danielle Olsen 12/29/2019, 8:19 PM

## 2019-12-29 NOTE — Transfer of Care (Signed)
Immediate Anesthesia Transfer of Care Note  Patient: Danielle Olsen  Procedure(s) Performed: CESAREAN SECTION (N/A )  Patient Location: PACU  Anesthesia Type:Spinal  Level of Consciousness: awake, alert  and oriented  Airway & Oxygen Therapy: Patient Spontanous Breathing  Post-op Assessment: Report given to RN and Post -op Vital signs reviewed and stable  Post vital signs: Reviewed and stable  Last Vitals:  Vitals Value Taken Time  BP 113/72 12/29/19 0915  Temp    Pulse 72 12/29/19 0918  Resp 19 12/29/19 0918  SpO2 99 % 12/29/19 0918  Vitals shown include unvalidated device data.  Last Pain:  Vitals:   12/29/19 0547  TempSrc: Oral         Complications: No apparent anesthesia complications

## 2019-12-30 LAB — CBC
HCT: 29.4 % — ABNORMAL LOW (ref 36.0–46.0)
Hemoglobin: 9.5 g/dL — ABNORMAL LOW (ref 12.0–15.0)
MCH: 27 pg (ref 26.0–34.0)
MCHC: 32.3 g/dL (ref 30.0–36.0)
MCV: 83.5 fL (ref 80.0–100.0)
Platelets: 193 10*3/uL (ref 150–400)
RBC: 3.52 MIL/uL — ABNORMAL LOW (ref 3.87–5.11)
RDW: 15.3 % (ref 11.5–15.5)
WBC: 10.9 10*3/uL — ABNORMAL HIGH (ref 4.0–10.5)
nRBC: 0 % (ref 0.0–0.2)

## 2019-12-30 LAB — BIRTH TISSUE RECOVERY COLLECTION (PLACENTA DONATION)

## 2019-12-30 NOTE — Plan of Care (Signed)
  Problem: Education: Goal: Knowledge of General Education information will improve Description: Including pain rating scale, medication(s)/side effects and non-pharmacologic comfort measures Outcome: Completed/Met   Problem: Clinical Measurements: Goal: Ability to maintain clinical measurements within normal limits will improve Outcome: Completed/Met   Problem: Education: Goal: Knowledge of condition will improve Outcome: Completed/Met   Problem: Education: Goal: Knowledge of General Education information will improve Description: Including pain rating scale, medication(s)/side effects and non-pharmacologic comfort measures Outcome: Completed/Met   Problem: Clinical Measurements: Goal: Ability to maintain clinical measurements within normal limits will improve Outcome: Completed/Met   Problem: Activity: Goal: Risk for activity intolerance will decrease Outcome: Completed/Met   Problem: Education: Goal: Knowledge of condition will improve Outcome: Completed/Met

## 2019-12-30 NOTE — Lactation Note (Signed)
This note was copied from a baby's chart. Lactation Consultation Note:    Patient Name: Boy Bobbyjo Bartoletti [id number] Date: 12/30/2019 Reason for consult: Follow-up assessment   Mother is a P2, infant is 3 hours old , mother reports that infant has not breastfed or will not sustain latch. Infant has had multiple spoon feedings of ebm totals of 9 ml since life.  Mother reports that she has to use a NS for 4 weeks with her first child due to unable to latch.  Mother has a #24 NS at the bedside. Multiple attempts to latch infant and infant refused. Showing no feeding cues. Infant was fed a few drops of colostrum with a spoon. Infant placed STS with mother . Discussed use of supplementing infant if unable to supplement infant while using a hand pump or DEBP.  Marland Kitchen Reviewed hand expression with mother. Observed tiny  drops of colostrum. Mother was given a harmony hand pump with instructions. Mothers nipples are erect with compressible breast tissue. No observed trama of mothers nipples. Mother was wearing shells which are causing edema.  .  Plan of Care : Breastfeed infant with feeding cues,   discussed use of donor milk if mother unable to pump or hand express. Supplement infant with edm/ donormilk  according to supplemental guidelines. Pump using a DEBP after each feeding for 15-20 mins.   Mother to continue to cue base feed infant and feed at least 8-12 times or more in 24 hours and advised to allow for cluster feeding infant as needed.  Mother to continue to due STS. Mother is aware of available LC services at [facility], BFSG'S, OP Dept, and phone # for questions or concerns about breastfeeding.  Mother receptive to all teaching and plan of care.     Maternal Data    Feeding Feeding Type: Breast Fed  LATCH Score Latch: Too sleepy or reluctant, no latch achieved, no sucking elicited.  Audible Swallowing: None  Type of Nipple: Flat(firms but has short shaft )  Comfort (Breast/Nipple): Soft /  non-tender  Hold (Positioning): Full assist, staff holds infant at breast  LATCH Score: 3  Interventions Interventions: Assisted with latch;Skin to skin;Hand express;Shells;Hand pump  Lactation Tools Discussed/Used Tools: Nipple Shields Nipple shield size: 20 Breast pump type: Manual   Consult Status Consult Status: Follow-up Date: 12/31/19 Follow-up type: In-patient    Jess Barters [facility] 12/30/2019, 9:11 AM

## 2019-12-30 NOTE — Plan of Care (Signed)
  Problem: Education: Goal: Knowledge of General Education information will improve Description: Including pain rating scale, medication(s)/side effects and non-pharmacologic comfort measures Outcome: Completed/Met   Problem: Clinical Measurements: Goal: Ability to maintain clinical measurements within normal limits will improve Outcome: Completed/Met

## 2019-12-30 NOTE — Progress Notes (Signed)
Subjective: Postpartum Day 1: Cesarean Delivery Patient reports tolerating PO.  Pain well-controlled.  Foley catheter just out, so has not yet voided  Objective: Vital signs in last 24 hours: Temp:  [97.4 F (36.3 C)-97.9 F (36.6 C)] 97.8 F (36.6 C) (05/22 0424) Pulse Rate:  [44-88] 78 (05/22 0424) Resp:  [16-20] 18 (05/22 0424) BP: (106-130)/(58-73) 110/68 (05/22 0424) SpO2:  [84 %-100 %] 98 % (05/22 0424)  Physical Exam:  General: alert and cooperative Lochia: appropriate Uterine Fundus: firm Incision: C/D/I   Recent Labs    12/28/19 1141 12/30/19 0701  HGB 10.8* 9.5*  HCT 33.5* 29.4*    Assessment/Plan: Status post Cesarean section. Doing well postoperatively.  Continue current care. They desire circumcision but baby still working on latching so will do tomorrow AM  Danielle Olsen 12/30/2019, 8:49 AM

## 2019-12-31 MED ORDER — ACETAMINOPHEN 500 MG PO TABS: 1000.0000 mg | ORAL_TABLET | Freq: Four times a day (QID) | ORAL | 0 refills | Status: DC

## 2019-12-31 MED ORDER — IBUPROFEN 800 MG PO TABS: 800.0000 mg | ORAL_TABLET | Freq: Three times a day (TID) | ORAL | 0 refills | Status: DC

## 2019-12-31 NOTE — Discharge Summary (Signed)
Postpartum Discharge Summary       Patient Name: Danielle Olsen DOB: 06-08-1986 MRN: [id number]  Date of admission: 12/29/2019 Delivery date:12/29/2019  Delivering provider: Paula Compton  Date of discharge: 12/31/2019  Admitting diagnosis: Status post repeat low transverse cesarean section [Z98.891] Cesarean deliv due to previous difficult deliv, deliv, curr hospitaliz [O82, Z87.59] S/P repeat low transverse C-section [id number]  Oligohydramnios Left ovarian cystectomy Intrauterine pregnancy: [redacted]w[redacted]d     Secondary diagnosis:  Active Problems:   Status post repeat low transverse cesarean section   Cesarean deliv due to previous difficult deliv, deliv, curr hospitaliz   S/P repeat low transverse C-section  Additional problems: none    Discharge diagnosis: Term Pregnancy Delivered                                              Post partum procedures:none  Complications: None  Hospital course: Sceduled C/S   34 y.o. yo G2P1001 at [redacted]w[redacted]d was admitted to the hospital 12/29/2019 for scheduled cesarean section with the following indication:oligohydramnios and prior c-section, declines TOL.Delivery details are as follows:  Membrane Rupture Time/Date: 7:54 AM ,12/29/2019   Delivery Method:C-Section, Low Transverse  Details of operation can be found in separate operative note.  Patient had an uncomplicated postpartum course.  She is ambulating, tolerating a regular diet, passing flatus, and urinating well. Patient is discharged home in stable condition on  12/31/19        Newborn Data: Birth date:12/29/2019  Birth time:7:54 AM  Gender:Female  Living status:Living  Apgars:8 ,8  Weight:2915 g     Magnesium Sulfate received: No BMZ received: No Rhophylac:No   Physical exam  Vitals:   12/30/19 0424 12/30/19 1438 12/30/19 2228 12/31/19 0914  BP: 110/68 108/66 127/73 123/73  Pulse: 78 93 77 85  Resp: 18 18  18   Temp: 97.8 F (36.6 C) 98.3 F (36.8 C) (!) 97.5 F (36.4 C) 99 F  (37.2 C)  TempSrc: Oral Oral Oral Oral  SpO2: 98%  98%   Weight:      Height:       General: alert and cooperative Lochia: appropriate Uterine Fundus: firm Incision: Dressing is clean, dry, and intact DVT Evaluation: No evidence of DVT seen on physical exam. Labs: Lab Results  Component Value Date   WBC 10.9 (H) 12/30/2019   HGB 9.5 (L) 12/30/2019   HCT 29.4 (L) 12/30/2019   MCV 83.5 12/30/2019   PLT 193 12/30/2019   No flowsheet data found. Edinburgh Score: Edinburgh Postnatal Depression Scale Screening Tool 12/29/2019  I have been able to laugh and see the funny side of things. 0  I have looked forward with enjoyment to things. 0  I have blamed myself unnecessarily when things went wrong. 1  I have been anxious or worried for no good reason. 1  I have felt scared or panicky for no good reason. 1  Things have been getting on top of me. 0  I have been so unhappy that I have had difficulty sleeping. 0  I have felt sad or miserable. 0  I have been so unhappy that I have been crying. 0  The thought of harming myself has occurred to me. 0  Edinburgh Postnatal Depression Scale Total 3     After visit meds:  Allergies as of 12/31/2019      Reactions  Penicillins    Childhood       Medication List    TAKE these medications   acetaminophen 500 MG tablet Commonly known as: TYLENOL Take 2 tablets (1,000 mg total) by mouth every 6 (six) hours.   budesonide 0.25 MG/2ML nebulizer solution Commonly known as: PULMICORT Take 0.25 mg by nebulization 2 (two) times daily as needed (asthma).   fexofenadine 30 MG/5ML suspension Commonly known as: ALLEGRA Take 30 mg by mouth daily.   ibuprofen 800 MG tablet Commonly known as: ADVIL Take 1 tablet (800 mg total) by mouth every 8 (eight) hours.   montelukast 10 MG tablet Commonly known as: SINGULAIR Take 1 tablet (10 mg total) by mouth at bedtime.   prenatal multivitamin Tabs tablet Take 1 tablet by mouth daily at 12  noon.        Discharge home in stable condition Infant Feeding: Bottle and Breast Infant Disposition:home with mother Discharge instruction: per After Visit Summary and Postpartum booklet. Activity: Advance as tolerated. Pelvic rest for 6 weeks.  Diet: routine diet Future Appointments:No future appointments. Follow up Visit: Follow-up Information    Paula Compton, MD. Schedule an appointment as soon as possible for a visit in 2 week(s).   Specialty: Obstetrics and Gynecology Why: incision check Contact information: [address] [place] [address] [phone]            Please schedule this patient for a In person postpartum visit in 2 weeks with the following provider: MD.  Delivery mode:  C-Section, Low Transverse  Anticipated Birth Control:  Unsure   12/31/2019 [name], MD

## 2019-12-31 NOTE — Lactation Note (Signed)
This note was copied from a baby's chart. Lactation Consultation Note  Patient Name: Danielle Olsen [id number] Date: 12/31/2019 Reason for consult: Follow-up assessment   Infant in nursery having circumcision.  Mother reports that infant has been bottle feeding due to weight loss. Mother reports that she is feeling changes with her breast. They are feeling hot and and heavy. Mother reports feeling these changes when her milk came to volume with her last child. Mother is experienced with pumping with last child. Mother reports that she plans to brestfeed longer with this child as she is staying home until December.   Mother advised in treatment and prevention of engorgement  .Marland Kitchen  Plan of care: Continue to cue base feed infant, Supplement infant with ebm/formula, according to supplemental guidelines. Pump using a DEBP after each feeding for 15-20 mins.   Mother to continue to cue base feed infant and feed at least 8-12 times or more in 24 hours and advised to allow for cluster feeding infant as needed.   Mother to continue to due STS. Mother is aware of available LC services at [facility], BFSG'S, OP Dept, and phone # for questions or concerns about breastfeeding.  Mother receptive to all teaching and plan of care.     Maternal Data    Feeding Feeding Type: Bottle Fed - Formula Nipple Type: Slow - flow  LATCH Score                   Interventions    Lactation Tools Discussed/Used     Consult Status      Darla Lesches 12/31/2019, 11:50 AM

## 2019-12-31 NOTE — Progress Notes (Signed)
Subjective: Postpartum Day 2: Cesarean Delivery Patient reports tolerating PO, + flatus and no problems voiding.    Objective: Vital signs in last 24 hours: Temp:  [97.5 F (36.4 C)-99 F (37.2 C)] 99 F (37.2 C) (05/23 0914) Pulse Rate:  [77-93] 85 (05/23 0914) Resp:  [18] 18 (05/23 0914) BP: (108-127)/(66-73) 123/73 (05/23 0914) SpO2:  [98 %] 98 % (05/22 2228)  Physical Exam:  General: alert and cooperative Lochia: appropriate Uterine Fundus: firm Incision: dressing pulling off, incision clear--will replace with new dressing DVT Evaluation: No evidence of DVT seen on physical exam.  Recent Labs    12/28/19 1141 12/30/19 0701  HGB 10.8* 9.5*  HCT 33.5* 29.4*    Assessment/Plan: Status post Cesarean section. Doing well postoperatively.  Discharge home with standard precautions and return to clinic in 2 weeks.  [name] 12/31/2019, 11:27 AM

## 2020-01-01 ENCOUNTER — Other Ambulatory Visit (HOSPITAL_COMMUNITY)
Admission: RE | Admit: 2020-01-01 | Discharge: 2020-01-01 | Disposition: A | Discharge status: Home / Self Care | Payer: 59 | Source: Ambulatory Visit | Attending: Obstetrics and Gynecology | Admitting: Obstetrics and Gynecology

## 2020-01-01 LAB — SURGICAL PATHOLOGY

## 2020-01-01 MED FILL — IBUPROFEN 800 MG TAB: 800 | 10 days supply | Qty: 30 | Fill #0

## 2020-01-25 DIAGNOSIS — D1801 Hemangioma of skin and subcutaneous tissue: Secondary | ICD-10-CM | POA: Diagnosis not present

## 2020-01-25 DIAGNOSIS — L821 Other seborrheic keratosis: Secondary | ICD-10-CM | POA: Diagnosis not present

## 2020-01-25 DIAGNOSIS — D225 Melanocytic nevi of trunk: Secondary | ICD-10-CM | POA: Diagnosis not present

## 2020-01-25 DIAGNOSIS — L304 Erythema intertrigo: Secondary | ICD-10-CM | POA: Diagnosis not present

## 2020-01-25 DIAGNOSIS — L700 Acne vulgaris: Secondary | ICD-10-CM | POA: Diagnosis not present

## 2020-02-16 DIAGNOSIS — Z3009 Encounter for other general counseling and advice on contraception: Secondary | ICD-10-CM | POA: Diagnosis not present

## 2020-02-16 DIAGNOSIS — Z1389 Encounter for screening for other disorder: Secondary | ICD-10-CM | POA: Diagnosis not present

## 2020-02-16 MED FILL — NORETHINDRONE 0.35 MG TABS: 0.35 | 28 days supply | Qty: 28 | Fill #0

## 2020-03-11 MED FILL — NORETHINDRONE 0.35 MG TABS: 0.35 | 28 days supply | Qty: 28 | Fill #1

## 2020-03-11 MED FILL — MONTELUKAST SOD 10 MG TAB: 10 | 90 days supply | Qty: 90 | Fill #1

## 2020-03-11 MED FILL — FLUCONAZOLE 150 MG TABS: 150 | 3 days supply | Qty: 2 | Fill #0

## 2020-04-11 MED FILL — NORETHINDRONE 0.35 MG TABS: 0.35 | 84 days supply | Qty: 84 | Fill #2

## 2020-10-24 ENCOUNTER — Emergency Department (HOSPITAL_COMMUNITY): Payer: BC Managed Care – PPO

## 2020-10-24 ENCOUNTER — Emergency Department (HOSPITAL_COMMUNITY)
Admission: EM | Admit: 2020-10-24 | Discharge: 2020-10-24 | Disposition: A | Discharge status: Home / Self Care | Payer: BC Managed Care – PPO | Attending: Emergency Medicine | Admitting: Emergency Medicine

## 2020-10-24 ENCOUNTER — Other Ambulatory Visit: Payer: Self-pay

## 2020-10-24 ENCOUNTER — Encounter (HOSPITAL_COMMUNITY): Payer: Self-pay | Admitting: Emergency Medicine

## 2020-10-24 DIAGNOSIS — J45909 Unspecified asthma, uncomplicated: Secondary | ICD-10-CM | POA: Insufficient documentation

## 2020-10-24 DIAGNOSIS — R1084 Generalized abdominal pain: Secondary | ICD-10-CM | POA: Diagnosis present

## 2020-10-24 DIAGNOSIS — N83291 Other ovarian cyst, right side: Secondary | ICD-10-CM | POA: Diagnosis not present

## 2020-10-24 DIAGNOSIS — N83209 Unspecified ovarian cyst, unspecified side: Secondary | ICD-10-CM

## 2020-10-24 LAB — COMPREHENSIVE METABOLIC PANEL
ALT: 27 U/L (ref 0–44)
AST: 20 U/L (ref 15–41)
Albumin: 3.6 g/dL (ref 3.5–5.0)
Alkaline Phosphatase: 65 U/L (ref 38–126)
Anion gap: 10 (ref 5–15)
BUN: 9 mg/dL (ref 6–20)
CO2: 20 mmol/L — ABNORMAL LOW (ref 22–32)
Calcium: 8.9 mg/dL (ref 8.9–10.3)
Chloride: 106 mmol/L (ref 98–111)
Creatinine, Ser: 0.67 mg/dL (ref 0.44–1.00)
GFR, Estimated: >60 mL/min (ref >60)
Glucose, Bld: 130 mg/dL — ABNORMAL HIGH (ref 70–99)
Potassium: 3.9 mmol/L (ref 3.5–5.1)
Sodium: 136 mmol/L (ref 135–145)
Total Bilirubin: 0.6 mg/dL (ref 0.3–1.2)
Total Protein: 6.4 g/dL — ABNORMAL LOW (ref 6.5–8.1)

## 2020-10-24 LAB — CBC
HCT: 38.9 % (ref 36.0–46.0)
Hemoglobin: 12.5 g/dL (ref 12.0–15.0)
MCH: 27.8 pg (ref 26.0–34.0)
MCHC: 32.1 g/dL (ref 30.0–36.0)
MCV: 86.4 fL (ref 80.0–100.0)
Platelets: 345 10*3/uL (ref 150–400)
RBC: 4.5 MIL/uL (ref 3.87–5.11)
RDW: 13.7 % (ref 11.5–15.5)
WBC: 12.1 10*3/uL — ABNORMAL HIGH (ref 4.0–10.5)
nRBC: 0 % (ref 0.0–0.2)

## 2020-10-24 LAB — URINALYSIS, ROUTINE W REFLEX MICROSCOPIC
Bilirubin Urine: NEGATIVE
Glucose, UA: NEGATIVE mg/dL
Hgb urine dipstick: NEGATIVE
Ketones, ur: NEGATIVE mg/dL
Leukocytes,Ua: NEGATIVE
Nitrite: NEGATIVE
Protein, ur: NEGATIVE mg/dL
Specific Gravity, Urine: 1.005 (ref 1.005–1.030)
pH: 6 (ref 5.0–8.0)

## 2020-10-24 LAB — I-STAT BETA HCG BLOOD, ED (MC, WL, AP ONLY): I-stat hCG, quantitative: <5 m[IU]/mL (ref <5)

## 2020-10-24 LAB — LIPASE, BLOOD: Lipase: 63 U/L — ABNORMAL HIGH (ref 11–51)

## 2020-10-24 MED ORDER — MORPHINE SULFATE (PF) 4 MG/ML IV SOLN
4.0000 mg | Freq: Once | INTRAVENOUS | Status: AC
  Administered 2020-10-24: 4 mg via INTRAVENOUS
  Filled 2020-10-24: qty 1

## 2020-10-24 MED ORDER — ONDANSETRON HCL 4 MG/2ML IJ SOLN
4.0000 mg | Freq: Once | INTRAMUSCULAR | Status: AC
  Administered 2020-10-24: 4 mg via INTRAVENOUS
  Filled 2020-10-24: qty 2

## 2020-10-24 MED ORDER — IOHEXOL 300 MG/ML  SOLN
100.0000 mL | Freq: Once | INTRAMUSCULAR | Status: AC | PRN
  Administered 2020-10-24: 100 mL via INTRAVENOUS

## 2020-10-24 NOTE — ED Provider Notes (Signed)
[name] Provider Note   CSN: [id number] Arrival date & time: 10/24/20  0115     History Chief Complaint  Patient presents with  . Abdominal Pain    Danielle Olsen is a 35 y.o. female presents to the Emergency Department complaining of acute, severe, persistent, somewhat improved generalized abdominal pain.  Pt reports that it woke her from sleep and her pain worsened significantly when she attempted to get up and go to the bathroom.  Patient reports she has never had pain like this before.  Previous surgeries include cesarean section x2 but no other abdominal surgeries.  Patient denies recent fever, chills.  She has associated nausea today but no vomiting or diarrhea.  No known sick contacts.  Patient reports that walking, riding in the car and movement makes her symptoms significantly worse.  Lying very still makes them better.  Patient does report a history of ovarian cyst which was removed during her recent cesarean section.  She reports she did not end up urinating and therefore does not know if she has any urinary symptoms.  Denies all vaginal symptoms.  Patient reports she is 9 months postpartum  The history is provided by the patient and medical records. No language interpreter was used.       Past Medical History:  Diagnosis Date  . Mild intermittent asthma   . Seasonal allergies     Patient Active Problem List   Diagnosis Date Noted  . Status post repeat low transverse cesarean section 12/29/2019  . Cesarean deliv due to previous difficult deliv, deliv, curr hospitaliz 12/29/2019  . S/P repeat low transverse C-section 12/29/2019    Past Surgical History:  Procedure Laterality Date  . CESAREAN SECTION    . CESAREAN SECTION N/A 12/29/2019   Procedure: CESAREAN SECTION;  Surgeon: Paula Compton, MD;  Location: [facility];  Service: Obstetrics;  Laterality: N/A;  Heather,  RNFA     OB History    Gravida  2   Para  2    Term  1   Preterm      AB      Living  1     SAB      IAB      Ectopic      Multiple  0   Live Births  1           Family History  Problem Relation Age of Onset  . Lung cancer Father   . COPD Mother     Social History   Tobacco Use  . Smoking status: Never Smoker  . Smokeless tobacco: Never Used  Vaping Use  . Vaping Use: Never used  Substance Use Topics  . Alcohol use: Not Currently  . Drug use: Never    Home Medications Prior to Admission medications   Medication Sig Start Date End Date Taking? Authorizing Provider  acetaminophen (TYLENOL) 500 MG tablet Take 2 tablets (1,000 mg total) by mouth every 6 (six) hours. Patient taking differently: Take 1,000 mg by mouth every 6 (six) hours as needed for mild pain or headache. 12/31/19  Yes Paula Compton, MD  albuterol (VENTOLIN HFA) 108 (90 Base) MCG/ACT inhaler Inhale 2 puffs into the lungs every 6 (six) hours as needed for wheezing or shortness of breath.   Yes [provider]  fexofenadine (ALLEGRA) 180 MG tablet Take 180 mg by mouth daily.   Yes [provider]  montelukast (SINGULAIR) 10 MG tablet Take 1 tablet (  10 mg total) by mouth at bedtime. 05/19/19  Yes Candee Furbish, MD  Multiple Vitamins-Minerals (ONE-A-DAY WOMENS PETITES) TABS Take 1 tablet by mouth daily. gummy   Yes [provider]  norethindrone (MICRONOR) 0.35 MG tablet Take 1 tablet by mouth daily. 09/18/20  Yes [provider]    Allergies    Penicillins  Review of Systems   Review of Systems  Constitutional: Negative for appetite change, diaphoresis, fatigue, fever and unexpected weight change.  HENT: Negative for mouth sores.   Eyes: Negative for visual disturbance.  Respiratory: Negative for cough, chest tightness, shortness of breath and wheezing.   Cardiovascular: Negative for chest pain.  Gastrointestinal: Positive for abdominal pain and nausea. Negative for constipation, diarrhea and vomiting.   Endocrine: Negative for polydipsia, polyphagia and polyuria.  Genitourinary: Negative for dysuria, frequency, hematuria and urgency.  Musculoskeletal: Negative for back pain and neck stiffness.  Skin: Negative for rash.  Allergic/Immunologic: Negative for immunocompromised state.  Neurological: Negative for syncope, light-headedness and headaches.  Hematological: Does not bruise/bleed easily.  Psychiatric/Behavioral: Negative for sleep disturbance. The patient is not nervous/anxious.     Physical Exam Updated Vital Signs BP (!) 135/99   Pulse 76   Temp 98.3 F (36.8 C) (Oral)   Resp 16   SpO2 100%   Physical Exam Vitals and nursing note reviewed.  Constitutional:      General: She is not in acute distress.    Appearance: She is not diaphoretic.  HENT:     Head: Normocephalic.  Eyes:     General: No scleral icterus.    Conjunctiva/sclera: Conjunctivae normal.  Cardiovascular:     Rate and Rhythm: Normal rate and regular rhythm.     Pulses: Normal pulses.          Radial pulses are 2+ on the right side and 2+ on the left side.  Pulmonary:     Effort: Pulmonary effort is normal. No tachypnea, accessory muscle usage, prolonged expiration, respiratory distress or retractions.     Breath sounds: Normal breath sounds. No stridor.     Comments: Equal chest rise. No increased work of breathing. Abdominal:     General: Bowel sounds are normal. There is no distension.     Palpations: Abdomen is soft.     Tenderness: There is abdominal tenderness ( Generalized abdominal tenderness worst in the right lower quadrant, suprapubic and left lower quadrant regions). There is no right CVA tenderness, left CVA tenderness, guarding or rebound.     Hernia: No hernia is present.  Musculoskeletal:     Cervical back: Normal range of motion.     Comments: Moves all extremities equally and without difficulty.  Skin:    General: Skin is warm and dry.     Capillary Refill: Capillary refill takes  less than 2 seconds.  Neurological:     Mental Status: She is alert.     GCS: GCS eye subscore is 4. GCS verbal subscore is 5. GCS motor subscore is 6.     Comments: Speech is clear and goal oriented.  Psychiatric:        Mood and Affect: Mood normal.     ED Results / Procedures / Treatments   Labs (all labs ordered are listed, but only abnormal results are displayed) Labs Reviewed  LIPASE, BLOOD - Abnormal; Notable for the following components:      Result Value   Lipase 63 (*)    All other components within normal limits  COMPREHENSIVE  METABOLIC PANEL - Abnormal; Notable for the following components:   CO2 20 (*)    Glucose, Bld 130 (*)    Total Protein 6.4 (*)    All other components within normal limits  CBC - Abnormal; Notable for the following components:   WBC 12.1 (*)    All other components within normal limits  URINALYSIS, ROUTINE W REFLEX MICROSCOPIC - Abnormal; Notable for the following components:   Color, Urine STRAW (*)    All other components within normal limits  I-STAT BETA HCG BLOOD, ED ([facility], [facility], [facility] ONLY)    EKG None  Radiology CT ABDOMEN PELVIS W CONTRAST  Result Date: 10/24/2020 CLINICAL DATA:  Acute nonlocalized abdominal pain EXAM: CT ABDOMEN AND PELVIS WITH CONTRAST TECHNIQUE: Multidetector CT imaging of the abdomen and pelvis was performed using the standard protocol following bolus administration of intravenous contrast. CONTRAST:  146mL OMNIPAQUE IOHEXOL 300 MG/ML  SOLN COMPARISON:  None. FINDINGS: Lower chest:  No contributory findings. Hepatobiliary: No focal liver abnormality.No evidence of biliary obstruction or stone. Pancreas: Unremarkable. Spleen: Unremarkable. Adrenals/Urinary Tract: Negative adrenals. No hydronephrosis or stone. Tiny presumed cyst in the upper pole left kidney. Unremarkable bladder. Stomach/Bowel:  No obstruction. No appendicitis. Vascular/Lymphatic: No acute vascular abnormality. No mass or adenopathy. Reproductive:Asymmetric  cystic enlargement of the left ovary without tubular component. The total cystic area measures up to 6.3 cm with individual locules measuring up to 4.2 cm. Symmetric appearance of adjacent enhancing gonadal vessels with no evident congestion of ovarian parenchyma. Other: Small volume pelvic fluid with graded increased density posteriorly. Musculoskeletal: No acute abnormalities. IMPRESSION: 1. Multicystic versus 6.3 cm septated cyst enlarging the left ovary. Recommend pelvic ultrasound follow-up in 6-12 weeks. 2. Small volume pelvic fluid with probable hematocrit level, a leaking hemorrhagic ovarian cyst is favored given the above. Electronically Signed   By: Monte Fantasia M.D.   On: 10/24/2020 04:59    Procedures Procedures   Medications Ordered in ED Medications  morphine 4 MG/ML injection 4 mg (4 mg Intravenous Given 10/24/20 0438)  ondansetron (ZOFRAN) injection 4 mg (4 mg Intravenous Given 10/24/20 0438)  iohexol (OMNIPAQUE) 300 MG/ML solution 100 mL (100 mLs Intravenous Contrast Given 10/24/20 0443)    ED Course  I have reviewed the triage vital signs and the nursing notes.  Pertinent labs & imaging results that were available during my care of the patient were reviewed by me and considered in my medical decision making (see chart for details).  Clinical Course as of 10/24/20 0541  Thu Oct 24, 2020  0400 WBC(!): 12.1 Mild leukocytosis noted [HM]  0400 Lipase(!): 63 Slightly elevated lipase [HM]  0400 I-stat hCG, quantitative: <5.0 Pregnancy test negative [HM]    Clinical Course User Index [HM] Muthersbaugh, Gwenlyn Perking   MDM Rules/Calculators/A&P                           Patient presents with abdominal pain and nausea   Considering appendicitis, diverticulitis, colitis, ovarian cyst, ovarian torsion, PID.  Additional history obtained:  Additional history obtained from husband. Previous records obtained and reviewed.    Lab Tests:  I Ordered, reviewed, and interpreted  labs, which included:  CBC, CMP, lipase -mild leukocytosis and minimally elevated lipase.  Other labs are within normal limits. Urinalysis -without evidence of urinary tract infection.   Imaging Studies ordered:  I have placed order for imaging including CT abdomen pelvis. I personally reviewed and interpreted imaging.  Hemorrhagic ovarian  cyst.   ED Course:  Patient with abdominal pain.  Well-appearing but moderate tenderness on exam.  Pain meds and nausea medication given.  CT scan pending.  Labs are reassuring.  5:40 AM She reports she feels significantly better.  CT scan with likely hemorrhagic ovarian cyst.  Highly doubt ovarian torsion.  Long discussion with patient and family.  They will follow up with OB/GYN and do not feel strongly that she needs ultrasound today.  I think this is very reasonable.  Strong return precautions given along with signs and symptoms ovarian torsion.  Patient voiced understanding and agreement wit the current medical evaluation and treatment plan.  Questions were answered to expressed satisfaction.    Strict return precautions given and patient is stable at time of discharge.    Portions of this note were generated with Lobbyist. Dictation errors may occur despite best attempts at proofreading.   The patient was discussed with and seen by Dr. Christy Gentles who agrees with the treatment plan.  Final Clinical Impression(s) / ED Diagnoses Final diagnoses:  Hemorrhagic ovarian cyst    Rx / DC Orders ED Discharge Orders    None       Muthersbaugh, Gwenlyn Perking 10/24/20 0541    Ripley Fraise, MD 10/24/20 [phone]

## 2020-10-24 NOTE — Discharge Instructions (Addendum)
1. Medications: Tylenol and ibuprofen for pain control, usual home medications 2. Treatment: rest, drink plenty of fluids,  3. Follow Up: Please followup with your OB/GYN in 2-3 days; Please return to the ER for severe onset worsening pain, pain associated with vomiting syncope or other concerns.

## 2020-10-24 NOTE — ED Triage Notes (Signed)
Pt c/o sudden onset abdominal pain, nausea and pain with urination that started tonight.

## 2021-02-04 ENCOUNTER — Telehealth: Payer: Self-pay | Admitting: Internal Medicine

## 2021-02-04 MED ORDER — MONTELUKAST SODIUM 10 MG PO TABS: 10.0000 mg | ORAL_TABLET | Freq: Every day | ORAL | 4 refills | Status: DC

## 2021-02-04 MED ORDER — ALBUTEROL SULFATE HFA 108 (90 BASE) MCG/ACT IN AERS: 2.0000 | INHALATION_SPRAY | Freq: Four times a day (QID) | RESPIRATORY_TRACT | 5 refills | Status: DC | PRN

## 2021-02-04 NOTE — Telephone Encounter (Signed)
refill 

## 2021-11-11 ENCOUNTER — Encounter: Payer: Self-pay | Admitting: Adult Health

## 2021-11-11 ENCOUNTER — Telehealth (INDEPENDENT_AMBULATORY_CARE_PROVIDER_SITE_OTHER): Payer: BC Managed Care – PPO | Admitting: Adult Health

## 2021-11-11 DIAGNOSIS — J4531 Mild persistent asthma with (acute) exacerbation: Secondary | ICD-10-CM

## 2021-11-11 MED ORDER — BUDESONIDE-FORMOTEROL FUMARATE 80-4.5 MCG/ACT IN AERO: 2.0000 | INHALATION_SPRAY | Freq: Two times a day (BID) | RESPIRATORY_TRACT | 5 refills | Status: DC

## 2021-11-11 MED ORDER — PREDNISONE 20 MG PO TABS: 20.0000 mg | ORAL_TABLET | Freq: Every day | ORAL | 0 refills | Status: DC

## 2021-11-11 NOTE — Progress Notes (Signed)
Virtual Visit via Video Note ? ?I connected with Danielle Olsen on 11/11/21 at 10:00 AM EDT by a video enabled telemedicine application and verified that I am speaking with the correct person using two identifiers. ? ?Location: ?Patient: Work  ?Provider: Office  ?  ?I discussed the limitations of evaluation and management by telemedicine and the availability of in person appointments. The patient expressed understanding and agreed to proceed. ? ?History of Present Illness: ?36 year old female never smoker followed for mild intermittent asthma and seasonal allergies.  Former patient of Dr. Thompson Caul last seen May 19, 2019.  History of childhood asthma.  She was hospitalized in third grade no hospitalizations as an adult.   ?Patient is a Education officer, museum with masters degree ? ?Today's video visit is for an acute office visit.  Patient complains over the last week or so her asthma has not been under good control.  She relates this to the increased pollen outside.  Patient has had increased dry cough and wheezing.  No discolored mucus fever.  She has been increasing her albuterol use over the last 2 days has had increased albuterol use several times throughout the day.  She is currently on Singulair and Allegra.  She denies any increased sinus drainage.  She is very active works full-time and has 2 small children.  She says she has tried several inhalers in the past including Symbicort and Pulmicort.  She is also used Advair in the past but this was during her teenage years.  She says she has occasionally had to take steroids but has been a long time.  Typically has been under good control and has not required a maintenance inhaler recently. ?She denies any current pregnancy.  Appetite is good.  No nausea vomiting diarrhea.  No reflux. ?No fever.   ? ?Previously lived in [place].  Says she was followed by pulmonology there.  Notes in care everywhere were reviewed. ? ?Past Medical History:  ?Diagnosis Date  ?  Mild intermittent asthma   ? Seasonal allergies   ? ?Current Outpatient Medications on File Prior to Visit  ?Medication Sig Dispense Refill  ? albuterol (VENTOLIN HFA) 108 (90 Base) MCG/ACT inhaler Inhale 2 puffs into the lungs every 6 (six) hours as needed for wheezing or shortness of breath. 1 each 5  ? fexofenadine (ALLEGRA) 180 MG tablet Take 180 mg by mouth daily.    ? montelukast (SINGULAIR) 10 MG tablet Take 1 tablet (10 mg total) by mouth at bedtime. 90 tablet 4  ? Multiple Vitamins-Minerals (ONE-A-DAY WOMENS PETITES) TABS Take 1 tablet by mouth daily. gummy    ? norgestimate-ethinyl estradiol (ORTHO-CYCLEN) 0.25-35 MG-MCG tablet Take 1 tablet by mouth daily.    ? acetaminophen (TYLENOL) 500 MG tablet Take 2 tablets (1,000 mg total) by mouth every 6 (six) hours. (Patient not taking: Reported on 11/11/2021) 30 tablet 0  ? ?No current facility-administered medications on file prior to visit.  ?  ?  ?Observations/Objective: ?Appears well.  Speech is normal.  No audible distress. ? ?Assessment and Plan: ?Acute asthma exacerbation suspected allergic in nature. ?Patient will begin Symbicort 80 2 puffs twice daily.  Continue on Allegra and Singulair.  Asthma action plan discussed.  We will have her follow back up in the office in 4 weeks with spirometry.  Consider FENO testing on return .  ?We will give her a short low-dose steroid burst just in case wheezing does not resolve.  Advised her that if asthma flare is not improving  can begin prednisone 20 mg daily for 5 days. ? ?Plan  ?Patient Instructions  ?Begin Symbicort 2 puffs Twice daily  , may use with spacer , rinse after use.  ?Prednisone '20mg'$  daily for 5 days to have on hold if wheezing does not resolve with Symbicort .  ?Continue on Allegra daily  ?Continue on Singulair daily.  ?Albuterol inhaler As needed   ?Follow up in office in 4 weeks with Spirometry and As needed   ?Please contact office for sooner follow up if symptoms do not improve or worsen or seek  emergency care  ? ?  ? ? ?Follow Up Instructions: ? ?  ?I discussed the assessment and treatment plan with the patient. The patient was provided an opportunity to ask questions and all were answere yeah it only did 7 days unless she just started it does always a possibility d. The patient agreed with the plan and demonstrated an understanding of the instructions. ?  ?The patient was advised to call back or seek an in-person evaluation if the symptoms worsen or if the condition fails to improve as anticipated. ? ?I provided 30  minutes of non-face-to-face time during this encounter. ? ? ?Rexene Edison, NP ? ?

## 2021-11-11 NOTE — Progress Notes (Signed)
Reviewed and agree with assessment/plan. ? ? ?Chesley Mires, MD ?[facility] ?11/11/2021, 1:35 PM ?Pager:  [phone] ? ?

## 2021-11-11 NOTE — Patient Instructions (Addendum)
Begin Symbicort 2 puffs Twice daily  , may use with spacer , rinse after use.  ?Prednisone '20mg'$  daily for 5 days to have on hold if wheezing does not resolve with Symbicort .  ?Continue on Allegra daily  ?Continue on Singulair daily.  ?Albuterol inhaler As needed   ?Follow up in office in 4 weeks with Spirometry and As needed   ?Please contact office for sooner follow up if symptoms do not improve or worsen or seek emergency care  ? ? ?

## 2022-02-05 ENCOUNTER — Other Ambulatory Visit: Payer: Self-pay | Admitting: Internal Medicine

## 2022-04-01 IMAGING — CT CT ABD-PELV W/ CM
4 series · 13 of 46 positions shown, 18 images · IV contrast (omnipaque)
Comparison: None.

CLINICAL DATA: Acute nonlocalized abdominal pain

EXAM:
CT ABDOMEN AND PELVIS WITH CONTRAST
TECHNIQUE: Multidetector CT imaging of the abdomen and pelvis was performed
using the standard protocol following bolus administration of
intravenous contrast.
CONTRAST:  100mL OMNIPAQUE IOHEXOL 300 MG/ML  SOLN

[Series 3: abdomen 5.0 · axial · 0.77mm/px · z∈[+812,+1162]mm · 7 of 94 slices shown, 12 images]
[im 12/94  soft-tissue]
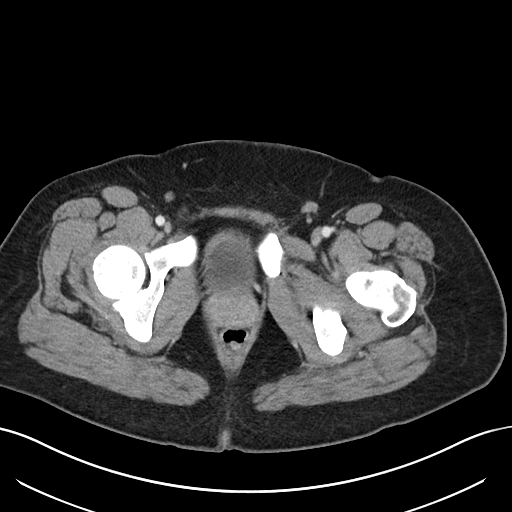
[im 12/94  bone]
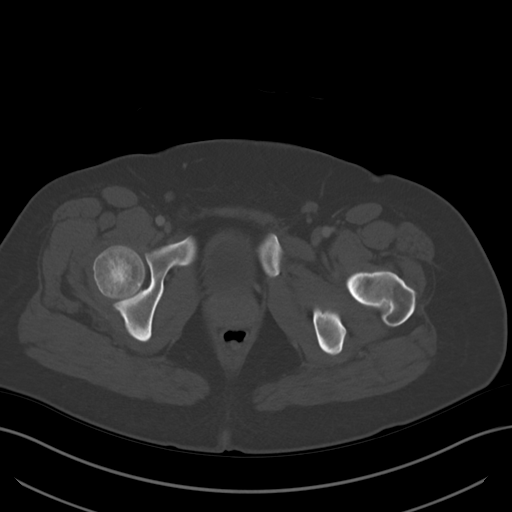
[im 24/94  soft-tissue]
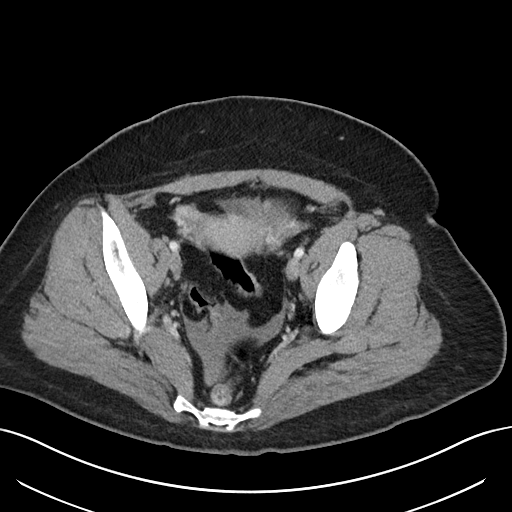
[im 35/94  soft-tissue]
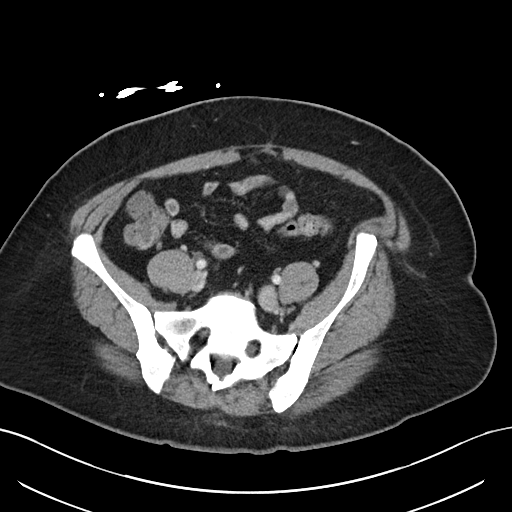
[im 47/94  soft-tissue]
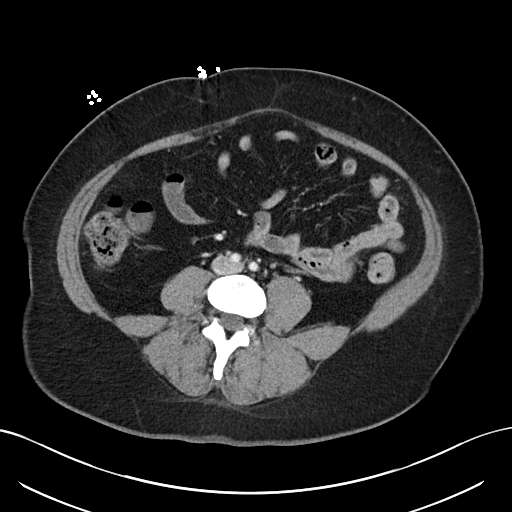
[im 47/94  lung]
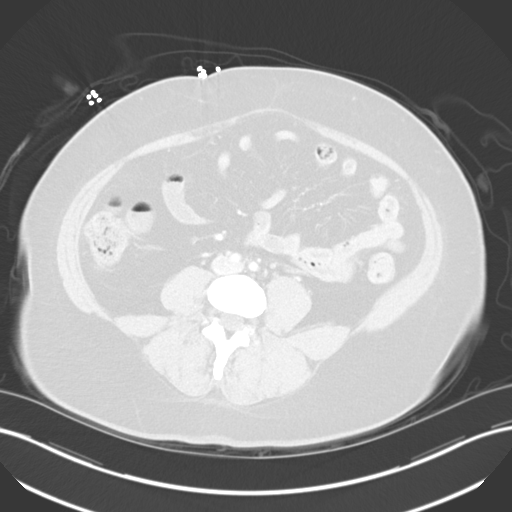
[im 59/94  soft-tissue]
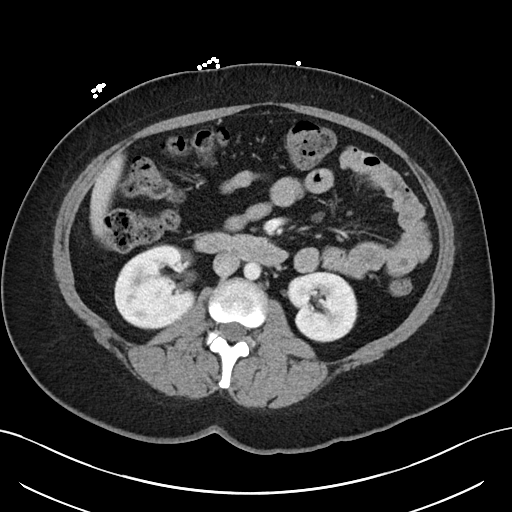
[im 59/94  lung]
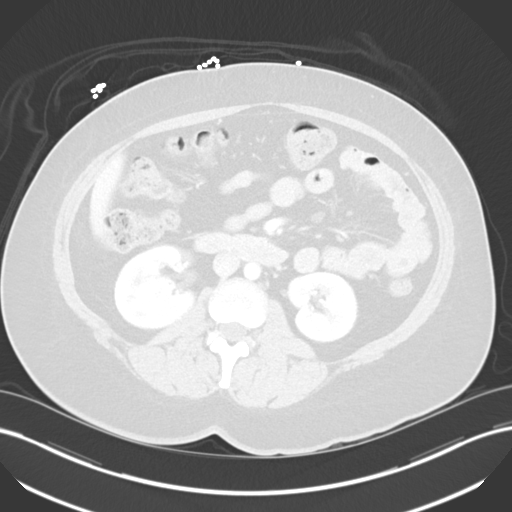
[im 70/94  soft-tissue]
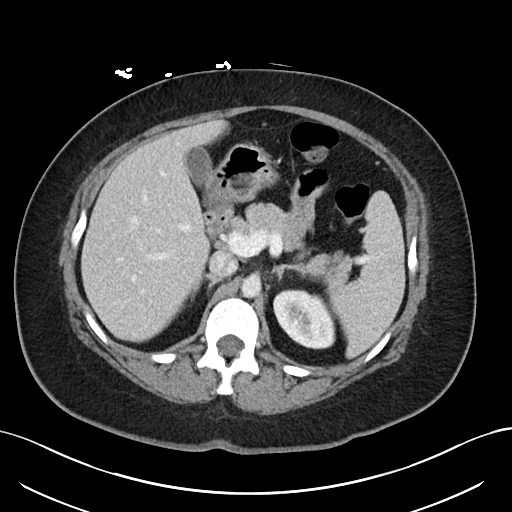
[im 70/94  lung]
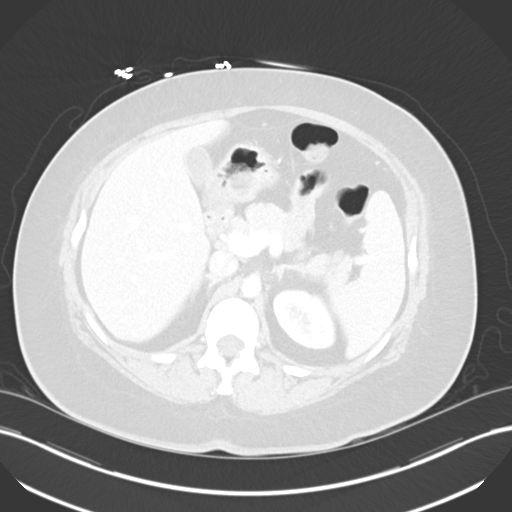
[im 82/94  soft-tissue]
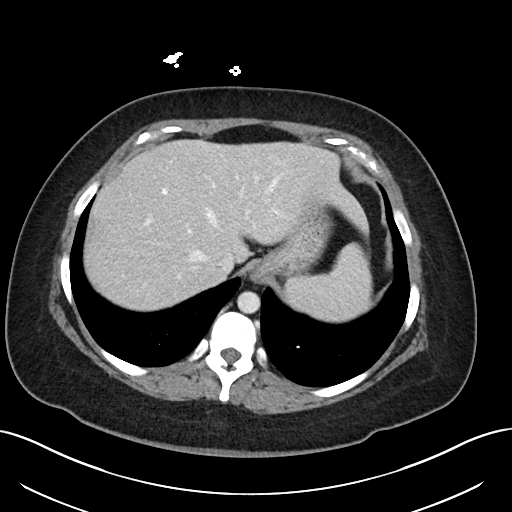
[im 82/94  lung]
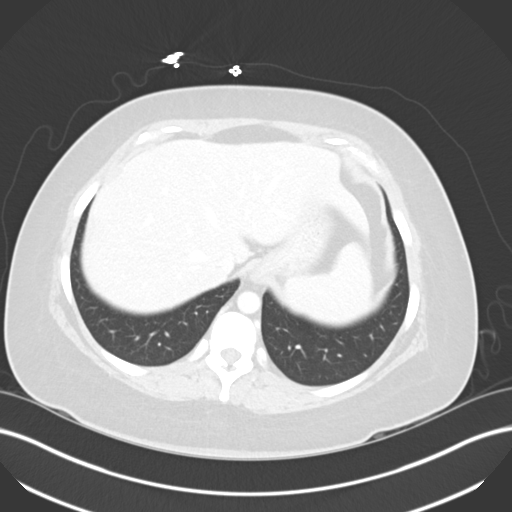

[Series 5: lung · axial · 0.77mm/px · z∈[+796,+838]mm · 2 of 235 slices shown]
[im 22/235  bone]
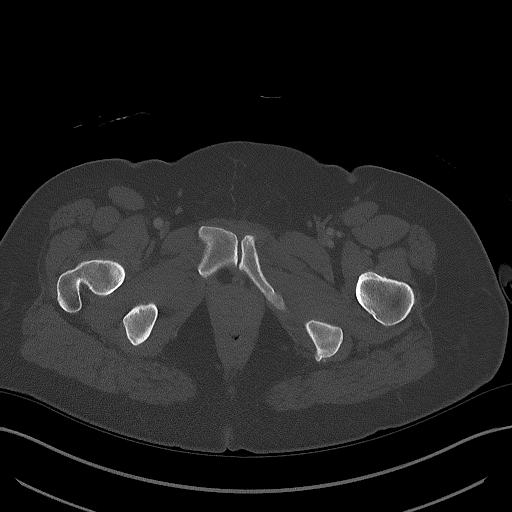
[im 43/235  bone]
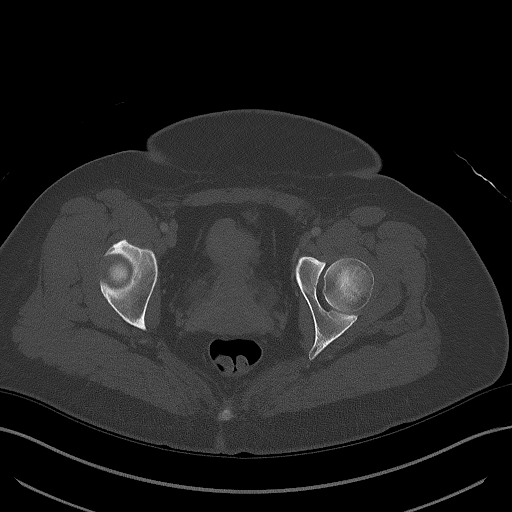

[Series 6: abdomen 3.0 mpr cor · coronal · 0.84mm/px · 3 of 103 slices shown]
[im 35/103  soft-tissue]
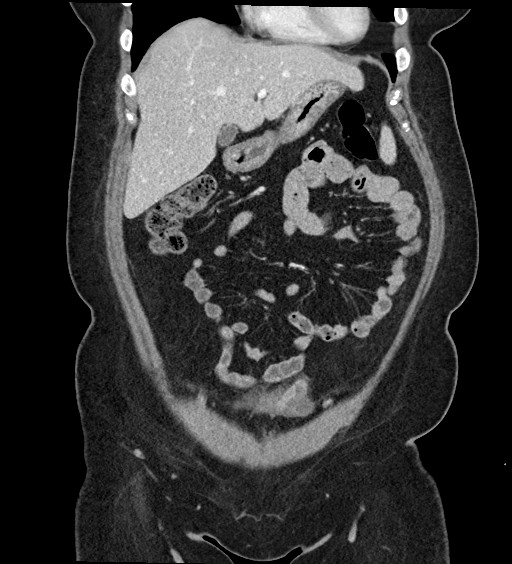
[im 46/103  soft-tissue]
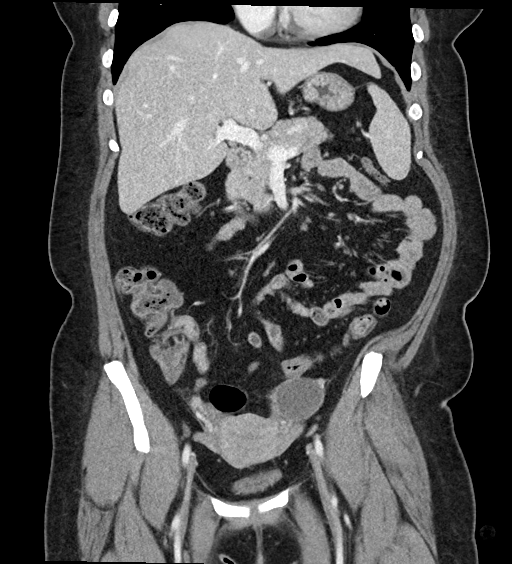
[im 57/103  soft-tissue]
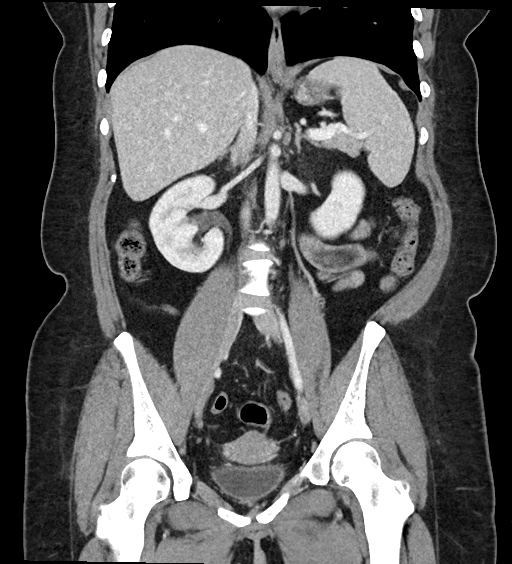

[Series 7: abdomen 3.0 mpr sag · sagittal · 0.60mm/px · 1 of 133 slices shown]
[im 45/133  soft-tissue]
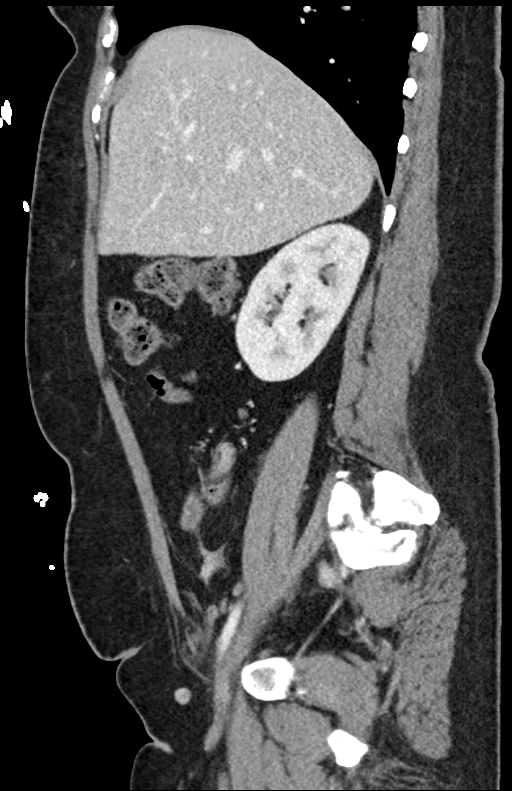

[13 of 46 positions shown; findings below may reference images not displayed]

FINDINGS: Lower chest:  No contributory findings.

Hepatobiliary: No focal liver abnormality.No evidence of biliary
obstruction or stone.

Pancreas: Unremarkable.

Spleen: Unremarkable.

Adrenals/Urinary Tract: Negative adrenals. No hydronephrosis or
stone. Tiny presumed cyst in the upper pole left kidney.
Unremarkable bladder.

Stomach/Bowel:  No obstruction. No appendicitis.

Vascular/Lymphatic: No acute vascular abnormality. No mass or
adenopathy.

Reproductive:Asymmetric cystic enlargement of the left ovary without
tubular component. The total cystic area measures up to 6.3 cm with
individual locules measuring up to 4.2 cm. Symmetric appearance of
adjacent enhancing gonadal vessels with no evident congestion of
ovarian parenchyma.

Other: Small volume pelvic fluid with graded increased density
posteriorly.

Musculoskeletal: No acute abnormalities.
IMPRESSION: 1. Multicystic versus 6.3 cm septated cyst enlarging the left ovary.
Recommend pelvic ultrasound follow-up in 6-12 weeks.
2. Small volume pelvic fluid with probable hematocrit level, a
leaking hemorrhagic ovarian cyst is favored given the above.

## 2022-08-05 ENCOUNTER — Other Ambulatory Visit (HOSPITAL_BASED_OUTPATIENT_CLINIC_OR_DEPARTMENT_OTHER): Payer: Self-pay

## 2022-08-05 MED ORDER — ALBUTEROL SULFATE HFA 108 (90 BASE) MCG/ACT IN AERS
2.0000 | INHALATION_SPRAY | Freq: Four times a day (QID) | RESPIRATORY_TRACT | 5 refills | Status: DC | PRN
  Filled 2022-08-05: qty 6.7, 25d supply, fill #0

## 2022-09-02 ENCOUNTER — Other Ambulatory Visit (HOSPITAL_BASED_OUTPATIENT_CLINIC_OR_DEPARTMENT_OTHER): Payer: Self-pay

## 2022-09-02 MED ORDER — MUPIROCIN 2 % EX OINT
TOPICAL_OINTMENT | CUTANEOUS | 0 refills | Status: DC
  Filled 2022-09-02: qty 22, 5d supply, fill #0

## 2022-09-02 MED ORDER — DOXYCYCLINE HYCLATE 100 MG PO TABS
100.0000 mg | ORAL_TABLET | Freq: Two times a day (BID) | ORAL | 0 refills | Status: DC
  Filled 2022-09-02: qty 14, 7d supply, fill #0

## 2023-04-16 ENCOUNTER — Other Ambulatory Visit (HOSPITAL_BASED_OUTPATIENT_CLINIC_OR_DEPARTMENT_OTHER): Payer: Self-pay

## 2023-04-16 MED ORDER — ALBUTEROL SULFATE HFA 108 (90 BASE) MCG/ACT IN AERS
1.0000 | INHALATION_SPRAY | RESPIRATORY_TRACT | 1 refills | Status: DC
  Filled 2023-04-16: qty 20.1, 90d supply, fill #0
  Filled 2023-07-13: qty 20.1, 90d supply, fill #1

## 2023-05-20 ENCOUNTER — Other Ambulatory Visit (HOSPITAL_BASED_OUTPATIENT_CLINIC_OR_DEPARTMENT_OTHER): Payer: Self-pay

## 2023-05-20 MED ORDER — METFORMIN HCL 1000 MG PO TABS
1000.0000 mg | ORAL_TABLET | Freq: Every day | ORAL | 3 refills | Status: DC
  Filled 2023-05-20: qty 90, 90d supply, fill #0

## 2023-06-10 ENCOUNTER — Encounter (HOSPITAL_BASED_OUTPATIENT_CLINIC_OR_DEPARTMENT_OTHER): Payer: Self-pay

## 2023-06-10 ENCOUNTER — Other Ambulatory Visit (HOSPITAL_BASED_OUTPATIENT_CLINIC_OR_DEPARTMENT_OTHER): Payer: Self-pay

## 2023-06-10 MED ORDER — LOMAIRA 8 MG PO TABS
8.0000 mg | ORAL_TABLET | Freq: Every day | ORAL | 0 refills | Status: DC
  Filled 2023-06-10: qty 30, 30d supply, fill #0

## 2023-06-14 ENCOUNTER — Other Ambulatory Visit: Payer: Self-pay

## 2023-06-14 ENCOUNTER — Other Ambulatory Visit (HOSPITAL_BASED_OUTPATIENT_CLINIC_OR_DEPARTMENT_OTHER): Payer: Self-pay

## 2023-06-15 ENCOUNTER — Other Ambulatory Visit (HOSPITAL_BASED_OUTPATIENT_CLINIC_OR_DEPARTMENT_OTHER): Payer: Self-pay

## 2023-07-06 ENCOUNTER — Other Ambulatory Visit (HOSPITAL_BASED_OUTPATIENT_CLINIC_OR_DEPARTMENT_OTHER): Payer: Self-pay

## 2023-07-06 MED ORDER — LOMAIRA 8 MG PO TABS
2.0000 | ORAL_TABLET | Freq: Every day | ORAL | 1 refills | Status: DC
  Filled 2023-07-13: qty 60, 30d supply, fill #0

## 2023-07-12 ENCOUNTER — Other Ambulatory Visit (HOSPITAL_BASED_OUTPATIENT_CLINIC_OR_DEPARTMENT_OTHER): Payer: Self-pay

## 2023-07-13 ENCOUNTER — Other Ambulatory Visit (HOSPITAL_BASED_OUTPATIENT_CLINIC_OR_DEPARTMENT_OTHER): Payer: Self-pay

## 2023-07-13 ENCOUNTER — Other Ambulatory Visit: Payer: Self-pay

## 2023-08-09 ENCOUNTER — Other Ambulatory Visit (HOSPITAL_COMMUNITY): Payer: Self-pay

## 2023-08-20 ENCOUNTER — Other Ambulatory Visit (HOSPITAL_BASED_OUTPATIENT_CLINIC_OR_DEPARTMENT_OTHER): Payer: Self-pay

## 2023-08-20 MED ORDER — METFORMIN HCL ER 500 MG PO TB24
500.0000 mg | ORAL_TABLET | Freq: Two times a day (BID) | ORAL | 3 refills | Status: DC
  Filled 2023-08-20: qty 180, 90d supply, fill #0

## 2023-09-16 ENCOUNTER — Other Ambulatory Visit: Payer: Self-pay

## 2023-09-16 ENCOUNTER — Other Ambulatory Visit (HOSPITAL_BASED_OUTPATIENT_CLINIC_OR_DEPARTMENT_OTHER): Payer: Self-pay

## 2023-09-16 MED ORDER — PHENTERMINE HCL 37.5 MG PO TABS: 37.5000 mg | ORAL_TABLET | Freq: Every day | ORAL | 1 refills | Status: DC

## 2023-12-01 NOTE — Pre-Procedure Instructions (Signed)
 Surgical Instructions   Your procedure is scheduled on Wednesday, April 30th. Report to [facility] Main Entrance "A" at 07:30 A.M., then check in with the Admitting office. Any questions or running late day of surgery: call [phone]  Questions prior to your surgery date: call [phone], Monday-Friday, 8am-4pm. If you experience any cold or flu symptoms such as cough, fever, chills, shortness of breath, etc. between now and your scheduled surgery, please notify us  at the above number.     Remember:  Do not eat or drink after midnight the night before your surgery     Take these medicines the morning of surgery with A SIP OF WATER  fexofenadine (ALLEGRA)  loratadine (CLARITIN)  norgestimate-ethinyl estradiol (ORTHO-CYCLEN)    May take these medicines IF NEEDED: albuterol  (VENTOLIN  HFA)- bring inhaler with you on day of surgery   Do not take metFORMIN  (GLUCOPHAGE -XR) the morning of surgery.  One week prior to surgery, STOP taking any Aspirin (unless otherwise instructed by your surgeon) Aleve, Naproxen, Ibuprofen , Motrin , Advil , Goody's, BC's, all herbal medications, fish oil, and non-prescription vitamins.                     Do NOT Smoke (Tobacco/Vaping) for 24 hours prior to your procedure.  If you use a CPAP at night, you may bring your mask/headgear for your overnight stay.   You will be asked to remove any contacts, glasses, piercing's, hearing aid's, dentures/partials prior to surgery. Please bring cases for these items if needed.    Patients discharged the day of surgery will not be allowed to drive home, and someone needs to stay with them for 24 hours.  SURGICAL WAITING ROOM VISITATION Patients may have no more than 2 support people in the waiting area - these visitors may rotate.   Pre-op nurse will coordinate an appropriate time for 1 ADULT support person, who may not rotate, to accompany patient in pre-op.  Children under the age of 54 must have an adult  with them who is not the patient and must remain in the main waiting area with an adult.  If the patient needs to stay at the hospital during part of their recovery, the visitor guidelines for inpatient rooms apply.  Please refer to the [facility] website for the visitor guidelines for any additional information.   If you received a COVID test during your pre-op visit  it is requested that you wear a mask when out in public, stay away from anyone that may not be feeling well and notify your surgeon if you develop symptoms. If you have been in contact with anyone that has tested positive in the last 10 days please notify you surgeon.      Pre-operative CHG Bathing Instructions   You can play a key role in reducing the risk of infection after surgery. Your skin needs to be as free of germs as possible. You can reduce the number of germs on your skin by washing with CHG (chlorhexidine gluconate) soap before surgery. CHG is an antiseptic soap that kills germs and continues to kill germs even after washing.   DO NOT use if you have an allergy to chlorhexidine/CHG or antibacterial soaps. If your skin becomes reddened or irritated, stop using the CHG and notify one of our RNs at [phone].              TAKE A SHOWER THE NIGHT BEFORE SURGERY AND THE DAY OF SURGERY    Please keep in  mind the following:  DO NOT shave, including legs and underarms, 48 hours prior to surgery.   You may shave your face before/day of surgery.  Place clean sheets on your bed the night before surgery Use a clean washcloth (not used since being washed) for each shower. DO NOT sleep with pet's night before surgery.  CHG Shower Instructions:  Wash your face and private area with normal soap. If you choose to wash your hair, wash first with your normal shampoo.  After you use shampoo/soap, rinse your hair and body thoroughly to remove shampoo/soap residue.  Turn the water OFF and apply half the bottle of CHG soap to a  CLEAN washcloth.  Apply CHG soap ONLY FROM YOUR NECK DOWN TO YOUR TOES (washing for 3-5 minutes)  DO NOT use CHG soap on face, private areas, open wounds, or sores.  Pay special attention to the area where your surgery is being performed.  If you are having back surgery, having someone wash your back for you may be helpful. Wait 2 minutes after CHG soap is applied, then you may rinse off the CHG soap.  Pat dry with a clean towel  Put on clean pajamas    Additional instructions for the day of surgery: DO NOT APPLY any lotions, deodorants, cologne, or perfumes.   Do not wear jewelry or makeup Do not wear nail polish, gel polish, artificial nails, or any other type of covering on natural nails (fingers and toes) Do not bring valuables to the hospital. [facility] is not responsible for valuables/personal belongings. Put on clean/comfortable clothes.  Please brush your teeth.  Ask your nurse before applying any prescription medications to the skin.

## 2023-12-02 ENCOUNTER — Encounter (HOSPITAL_COMMUNITY): Payer: Self-pay | Admitting: *Deleted

## 2023-12-02 ENCOUNTER — Encounter (HOSPITAL_COMMUNITY)
Admission: RE | Admit: 2023-12-02 | Discharge: 2023-12-02 | Disposition: A | Discharge status: Home / Self Care | Source: Ambulatory Visit | Attending: Obstetrics and Gynecology | Admitting: Obstetrics and Gynecology

## 2023-12-02 ENCOUNTER — Other Ambulatory Visit: Payer: Self-pay

## 2023-12-02 DIAGNOSIS — Z01812 Encounter for preprocedural laboratory examination: Secondary | ICD-10-CM | POA: Insufficient documentation

## 2023-12-02 DIAGNOSIS — N9489 Other specified conditions associated with female genital organs and menstrual cycle: Secondary | ICD-10-CM | POA: Diagnosis not present

## 2023-12-02 HISTORY — DX: Cyst of kidney, acquired: N28.1

## 2023-12-02 HISTORY — DX: Other specified postprocedural states: Z98.890

## 2023-12-02 LAB — CBC
HCT: 39.4 % (ref 36.0–46.0)
Hemoglobin: 13.3 g/dL (ref 12.0–15.0)
MCH: 29.5 pg (ref 26.0–34.0)
MCHC: 33.8 g/dL (ref 30.0–36.0)
MCV: 87.4 fL (ref 80.0–100.0)
Platelets: 289 10*3/uL (ref 150–400)
RBC: 4.51 MIL/uL (ref 3.87–5.11)
RDW: 12.8 % (ref 11.5–15.5)
WBC: 9.1 10*3/uL (ref 4.0–10.5)
nRBC: 0 % (ref 0.0–0.2)

## 2023-12-02 LAB — TYPE AND SCREEN
ABO/RH(D): A POS
Antibody Screen: NEGATIVE

## 2023-12-02 NOTE — Progress Notes (Signed)
 PCP - Dr. Windell Hasty Cardiologist - denies  PPM/ICD - denies   Chest x-ray - many years ago (as a child) EKG - denies Stress Test - denies ECHO - denies Cardiac Cath - denies  Sleep Study - denies   DM- denies  Last dose of GLP1 agonist-  n/a   ASA/Blood Thinner Instructions: n/a   ERAS Protcol - no, NPO   COVID TEST- n/a   Anesthesia review: no  Patient denies shortness of breath, fever, cough and chest pain at PAT appointment   All instructions explained to the patient, with a verbal understanding of the material. Patient agrees to go over the instructions while at home for a better understanding.  The opportunity to ask questions was provided.

## 2023-12-07 NOTE — H&P (Signed)
 Danielle Olsen is an 38 y.o. female presenting for scheduled procedure. Still with intermittent pelvic pain/pressure.   Pertinent Gynecological History: Menses:  regular withdrawal bleeding in setting of COC Bleeding: WNL Contraception: OCP (estrogen/progesterone) DES exposure: denies Blood transfusions: none Sexually transmitted diseases: no past history Previous GYN Procedures:  none   Last mammogram: normal Date: 10/01/21 BIRADS 1 Last pap: normal Date: 09/28/22 NIL/neg OB History: G2, P2002, h/o csx x2 with lf ov cystectomy in G2 intraop   Menstrual History: Menarche age: middle school Patient's last menstrual period was 11/25/2023 (exact date).    Past Medical History:  Diagnosis Date   Mild intermittent asthma    PONV (postoperative nausea and vomiting)    Renal cyst    Seasonal allergies     Past Surgical History:  Procedure Laterality Date   CESAREAN SECTION  2015   CESAREAN SECTION N/A 12/29/2019   Procedure: CESAREAN SECTION;  Surgeon: Rogene Claude, MD;  Location: [facility] LD ORS;  Service: Obstetrics;  Laterality: N/A;  Pattie Borders,  RNFA   WISDOM TOOTH EXTRACTION      Family History  Problem Relation Age of Onset   Lung cancer Father    COPD Mother     Social History:  reports that she has never smoked. She has never used smokeless tobacco. She reports that she does not currently use alcohol. She reports that she does not use drugs.  Allergies:  Allergies  Allergen Reactions   Penicillins Hives    Childhood     No medications prior to admission.    Review of Systems  Constitutional:  Negative for chills and fever.  Respiratory:  Negative for shortness of breath.   Cardiovascular:  Negative for chest pain, palpitations and leg swelling.  Gastrointestinal:  Negative for abdominal pain, nausea and vomiting.  Genitourinary:  Positive for pelvic pain.  Neurological:  Negative for dizziness, weakness and headaches.  Psychiatric/Behavioral:  Negative for  suicidal ideas.     Last menstrual period 11/25/2023, unknown if currently breastfeeding. Physical Exam Constitutional *General Appearance: healthy-appearing, overweight  Head Head: normocephalic  Cardiovascular *Auscultation: RRR, no murmur *Peripheral Vascular: no varicosities, no edema  Lungs *Respiratory Effort: no accessory muscle usage, no intercostal retractions *Auscultation: clear to auscultation, no wheezing, no rhonchi Inspection: normal respiratory rate  Abdomen *Inspection/Palpation/Auscultation: non-distended, no tenderness, no rebound, no guarding, soft, abdomen: incision Pfannenstiel (notes increased pressure sensation with suprapubic pressure, palpable mass at 11wks)  Back Appearance normal Palpation no costovertebral angle tenderness  Extremities Legs: normal Arms: normal Pulses: normal  Neurological System Impressions: motor: no deficits, sensory: no deficits  Psychiatric *Mood and Affect: active and alert, normal mood No results found for this or any previous visit (from the past 24 hours).  No results found. TVUS 11/02/23: Uterus 8.5x4.8x4.3cm, EMS 3.32mm, small fibroid 2x1.7x1.7cm Lf Ov: 8.2x7.7x6.2cm; two cysts noted 7x4.4x3.8cm, 4.5x4x5.7cm (both appear to be endometrioma with ground glass opacity) Rt ov: 4.7x4.3x4.3cm; 4.4x3.8x3.3cm (simple)  Assessment/Plan: 37yo G2P2002 on COC who presented outpatient initially for f/u of TVUS which shows multicystic left ovary with no normal tissue. Case also briefly discussed with GYn Onc who agree with surgical mgmt in the form of laparoscopic left oophorectomy with pelvic washing, possible mini-lap incision. H/o recurrently ovarian cysts. 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  Risks of procedure include infection of the uterus, pelvic organs, or skin, inadvertent injury to internal organs, such as bowel or bladder. If there is major injury, extensive surgery may be required. If  injury is  minor, it may be treated with relative ease. Discussed possibility of excessive blood loss and transfusion. Patient accepts possibility of conversion to open procedure. Patient accepts the possibility of blood transfusion, if necessary. Bowel and/or bladder injury may require prolonged inpatient stay and possible colostomy, Foley catheter, etc, as deemed fit by other surgeon. Patient understands and agrees to move forward with surgery.  Martin Slay Danielle Olsen 12/07/2023, 10:32 PM

## 2023-12-08 ENCOUNTER — Encounter (HOSPITAL_COMMUNITY)
Admission: RE | Disposition: A | Discharge status: Home / Self Care | Payer: Self-pay | Source: Home / Self Care | Attending: Obstetrics and Gynecology

## 2023-12-08 ENCOUNTER — Other Ambulatory Visit (HOSPITAL_BASED_OUTPATIENT_CLINIC_OR_DEPARTMENT_OTHER): Payer: Self-pay

## 2023-12-08 ENCOUNTER — Encounter (HOSPITAL_COMMUNITY): Payer: Self-pay | Admitting: Obstetrics and Gynecology

## 2023-12-08 ENCOUNTER — Ambulatory Visit (HOSPITAL_COMMUNITY)
Admission: RE | Admit: 2023-12-08 | Discharge: 2023-12-08 | Disposition: A | Discharge status: Home / Self Care | Payer: Self-pay | Attending: Obstetrics and Gynecology | Admitting: Obstetrics and Gynecology

## 2023-12-08 ENCOUNTER — Other Ambulatory Visit: Payer: Self-pay

## 2023-12-08 ENCOUNTER — Ambulatory Visit (HOSPITAL_COMMUNITY): Payer: Self-pay | Admitting: Anesthesiology

## 2023-12-08 ENCOUNTER — Ambulatory Visit (HOSPITAL_BASED_OUTPATIENT_CLINIC_OR_DEPARTMENT_OTHER): Payer: Self-pay | Admitting: Anesthesiology

## 2023-12-08 DIAGNOSIS — J4520 Mild intermittent asthma, uncomplicated: Secondary | ICD-10-CM | POA: Diagnosis not present

## 2023-12-08 DIAGNOSIS — N83292 Other ovarian cyst, left side: Secondary | ICD-10-CM

## 2023-12-08 DIAGNOSIS — N83202 Unspecified ovarian cyst, left side: Secondary | ICD-10-CM | POA: Diagnosis not present

## 2023-12-08 DIAGNOSIS — N9489 Other specified conditions associated with female genital organs and menstrual cycle: Secondary | ICD-10-CM

## 2023-12-08 DIAGNOSIS — Z01818 Encounter for other preprocedural examination: Secondary | ICD-10-CM

## 2023-12-08 HISTORY — PX: LAPAROSCOPIC LYSIS OF ADHESIONS: SHX5905

## 2023-12-08 HISTORY — PX: LAPAROSCOPY: SHX197

## 2023-12-08 LAB — POCT PREGNANCY, URINE: Preg Test, Ur: NEGATIVE

## 2023-12-08 SURGERY — OOPHORECTOMY, LAPAROSCOPIC
Anesthesia: General | Site: Abdomen

## 2023-12-08 MED ORDER — MIDAZOLAM HCL 2 MG/2ML IJ SOLN
INTRAMUSCULAR | Status: DC | PRN
Start: 2023-12-08 — End: 2023-12-08
  Administered 2023-12-08: 2 mg via INTRAVENOUS

## 2023-12-08 MED ORDER — HYDROMORPHONE HCL 1 MG/ML IJ SOLN
INTRAMUSCULAR | Status: AC
  Filled 2023-12-08: qty 1

## 2023-12-08 MED ORDER — ACETAMINOPHEN 500 MG PO TABS: 1000.0000 mg | ORAL_TABLET | ORAL | Status: AC

## 2023-12-08 MED ORDER — IBUPROFEN 800 MG PO TABS
800.0000 mg | ORAL_TABLET | Freq: Three times a day (TID) | ORAL | 0 refills | Status: AC | PRN
  Filled 2023-12-08: qty 30, 10d supply, fill #0

## 2023-12-08 MED ORDER — ONDANSETRON HCL 4 MG/2ML IJ SOLN
INTRAMUSCULAR | Status: AC
  Filled 2023-12-08: qty 2

## 2023-12-08 MED ORDER — LIDOCAINE 2% (20 MG/ML) 5 ML SYRINGE
INTRAMUSCULAR | Status: DC | PRN
  Administered 2023-12-08: 60 mg via INTRAVENOUS

## 2023-12-08 MED ORDER — ROCURONIUM BROMIDE 10 MG/ML (PF) SYRINGE
PREFILLED_SYRINGE | INTRAVENOUS | Status: DC | PRN
  Administered 2023-12-08: 50 mg via INTRAVENOUS
  Administered 2023-12-08: 20 mg via INTRAVENOUS
  Administered 2023-12-08: 10 mg via INTRAVENOUS

## 2023-12-08 MED ORDER — ONDANSETRON HCL 4 MG/2ML IJ SOLN
INTRAMUSCULAR | Status: AC
Start: 2023-12-08 — End: ?
  Filled 2023-12-08: qty 2

## 2023-12-08 MED ORDER — ONDANSETRON 4 MG PO TBDP
4.0000 mg | ORAL_TABLET | Freq: Three times a day (TID) | ORAL | 0 refills | Status: AC | PRN
  Filled 2023-12-08: qty 18, 6d supply, fill #0

## 2023-12-08 MED ORDER — OXYCODONE HCL 5 MG PO TABS
5.0000 mg | ORAL_TABLET | Freq: Four times a day (QID) | ORAL | 0 refills | Status: AC | PRN
Start: 2023-12-08 — End: ?
  Filled 2023-12-08: qty 10, 3d supply, fill #0

## 2023-12-08 MED ORDER — ORAL CARE MOUTH RINSE: 15.0000 mL | Freq: Once | OROMUCOSAL | Status: AC

## 2023-12-08 MED ORDER — KETOROLAC TROMETHAMINE 15 MG/ML IJ SOLN
INTRAMUSCULAR | Status: DC | PRN
  Administered 2023-12-08: 30 mg via INTRAVENOUS

## 2023-12-08 MED ORDER — POVIDONE-IODINE 10 % EX SWAB: 2.0000 | Freq: Once | CUTANEOUS | Status: DC

## 2023-12-08 MED ORDER — ACETAMINOPHEN 500 MG PO TABS
ORAL_TABLET | ORAL | Status: AC
  Administered 2023-12-08: 1000 mg via ORAL
  Filled 2023-12-08: qty 2

## 2023-12-08 MED ORDER — HYDROMORPHONE HCL 1 MG/ML IJ SOLN
INTRAMUSCULAR | Status: DC | PRN
  Administered 2023-12-08: .5 mg via INTRAVENOUS

## 2023-12-08 MED ORDER — SODIUM CHLORIDE 0.9 % IR SOLN
Status: DC | PRN
  Administered 2023-12-08: 1

## 2023-12-08 MED ORDER — DEXMEDETOMIDINE HCL IN NACL 80 MCG/20ML IV SOLN
INTRAVENOUS | Status: DC | PRN
  Administered 2023-12-08 (×2): 10 ug via INTRAVENOUS

## 2023-12-08 MED ORDER — BUPIVACAINE HCL (PF) 0.25 % IJ SOLN
INTRAMUSCULAR | Status: AC
  Filled 2023-12-08: qty 30

## 2023-12-08 MED ORDER — ONDANSETRON HCL 4 MG/2ML IJ SOLN
INTRAMUSCULAR | Status: DC | PRN
  Administered 2023-12-08 (×2): 4 mg via INTRAVENOUS

## 2023-12-08 MED ORDER — SCOPOLAMINE 1 MG/3DAYS TD PT72: 1.0000 | MEDICATED_PATCH | TRANSDERMAL | Status: DC

## 2023-12-08 MED ORDER — HYDROMORPHONE HCL 1 MG/ML IJ SOLN
INTRAMUSCULAR | Status: AC
  Filled 2023-12-08: qty 0.5

## 2023-12-08 MED ORDER — ROCURONIUM BROMIDE 10 MG/ML (PF) SYRINGE
PREFILLED_SYRINGE | INTRAVENOUS | Status: AC
  Filled 2023-12-08: qty 10

## 2023-12-08 MED ORDER — HEMOSTATIC AGENTS (NO CHARGE) OPTIME
TOPICAL | Status: DC | PRN
  Administered 2023-12-08: 1 via TOPICAL

## 2023-12-08 MED ORDER — HYDROMORPHONE HCL 1 MG/ML IJ SOLN
0.2500 mg | INTRAMUSCULAR | Status: DC | PRN
  Administered 2023-12-08: 0.25 mg via INTRAVENOUS

## 2023-12-08 MED ORDER — BUPIVACAINE HCL (PF) 0.25 % IJ SOLN
INTRAMUSCULAR | Status: DC | PRN
  Administered 2023-12-08: 19 mL

## 2023-12-08 MED ORDER — SUGAMMADEX SODIUM 200 MG/2ML IV SOLN
INTRAVENOUS | Status: DC | PRN
  Administered 2023-12-08: 200 mg via INTRAVENOUS

## 2023-12-08 MED ORDER — DEXAMETHASONE SODIUM PHOSPHATE 10 MG/ML IJ SOLN
INTRAMUSCULAR | Status: DC | PRN
  Administered 2023-12-08: 10 mg via INTRAVENOUS

## 2023-12-08 MED ORDER — SCOPOLAMINE 1 MG/3DAYS TD PT72
MEDICATED_PATCH | TRANSDERMAL | Status: AC
  Administered 2023-12-08: 1.5 mg via TRANSDERMAL
  Filled 2023-12-08: qty 1

## 2023-12-08 MED ORDER — LACTATED RINGERS IV SOLN: INTRAVENOUS | Status: DC

## 2023-12-08 MED ORDER — FENTANYL CITRATE (PF) 250 MCG/5ML IJ SOLN
INTRAMUSCULAR | Status: AC
  Filled 2023-12-08: qty 5

## 2023-12-08 MED ORDER — DEXAMETHASONE SODIUM PHOSPHATE 10 MG/ML IJ SOLN
INTRAMUSCULAR | Status: AC
  Filled 2023-12-08: qty 1

## 2023-12-08 MED ORDER — FENTANYL CITRATE (PF) 250 MCG/5ML IJ SOLN
INTRAMUSCULAR | Status: DC | PRN
  Administered 2023-12-08 (×5): 50 ug via INTRAVENOUS

## 2023-12-08 MED ORDER — MIDAZOLAM HCL 2 MG/2ML IJ SOLN
INTRAMUSCULAR | Status: AC
  Filled 2023-12-08: qty 2

## 2023-12-08 MED ORDER — PROPOFOL 10 MG/ML IV BOLUS
INTRAVENOUS | Status: AC
  Filled 2023-12-08: qty 20

## 2023-12-08 MED ORDER — BUPIVACAINE HCL (PF) 0.5 % IJ SOLN
INTRAMUSCULAR | Status: AC
  Filled 2023-12-08: qty 30

## 2023-12-08 MED ORDER — CHLORHEXIDINE GLUCONATE 0.12 % MT SOLN: 15.0000 mL | Freq: Once | OROMUCOSAL | Status: AC

## 2023-12-08 MED ORDER — KETOROLAC TROMETHAMINE 30 MG/ML IJ SOLN
INTRAMUSCULAR | Status: AC
  Filled 2023-12-08: qty 1

## 2023-12-08 MED ORDER — CHLORHEXIDINE GLUCONATE 0.12 % MT SOLN
OROMUCOSAL | Status: AC
  Administered 2023-12-08: 15 mL via OROMUCOSAL
  Filled 2023-12-08: qty 15

## 2023-12-08 MED ORDER — PROPOFOL 10 MG/ML IV BOLUS
INTRAVENOUS | Status: DC | PRN
  Administered 2023-12-08: 130 mg via INTRAVENOUS
  Administered 2023-12-08: 150 ug/kg/min via INTRAVENOUS
  Administered 2023-12-08: 50 mg via INTRAVENOUS
  Administered 2023-12-08: 30 mg via INTRAVENOUS

## 2023-12-08 MED ORDER — LIDOCAINE 2% (20 MG/ML) 5 ML SYRINGE
INTRAMUSCULAR | Status: AC
  Filled 2023-12-08: qty 5

## 2023-12-08 SURGICAL SUPPLY — 34 items
APPLICATOR ARISTA FLEXITIP XL (MISCELLANEOUS) IMPLANT
COVER MAYO STAND XLG (MISCELLANEOUS) ×3 IMPLANT
DERMABOND ADVANCED .7 DNX12 (GAUZE/BANDAGES/DRESSINGS) ×3 IMPLANT
DRAPE SURG IRRIG POUCH 19X23 (DRAPES) ×3 IMPLANT
DRSG OPSITE POSTOP 3X4 (GAUZE/BANDAGES/DRESSINGS) IMPLANT
DURAPREP 26ML APPLICATOR (WOUND CARE) ×3 IMPLANT
GAUZE 4X4 16PLY ~~LOC~~+RFID DBL (SPONGE) IMPLANT
GLOVE BIO SURGEON STRL SZ 6 (GLOVE) ×3 IMPLANT
GLOVE BIOGEL PI IND STRL 6.5 (GLOVE) ×6 IMPLANT
GLOVE BIOGEL PI IND STRL 7.0 (GLOVE) ×6 IMPLANT
GOWN STRL REUS W/ TWL LRG LVL3 (GOWN DISPOSABLE) ×3 IMPLANT
HEMOSTAT ARISTA ABSORB 3G PWDR (HEMOSTASIS) IMPLANT
IRRIGATION SUCT STRKRFLW 2 WTP (MISCELLANEOUS) IMPLANT
KIT PINK PAD W/HEAD ARE REST (MISCELLANEOUS) ×3 IMPLANT
KIT PINK PAD W/HEAD ARM REST (MISCELLANEOUS) ×3 IMPLANT
KIT TURNOVER KIT B (KITS) ×3 IMPLANT
NDL INSUFFLATION 14GA 120MM (NEEDLE) ×3 IMPLANT
NEEDLE INSUFFLATION 14GA 120MM (NEEDLE) ×3 IMPLANT
NS IRRIG 1000ML POUR BTL (IV SOLUTION) ×3 IMPLANT
PACK LAPAROSCOPY BASIN (CUSTOM PROCEDURE TRAY) ×3 IMPLANT
SET TUBE SMOKE EVAC HIGH FLOW (TUBING) ×3 IMPLANT
SHEARS HARMONIC ACE PLUS 36CM (ENDOMECHANICALS) IMPLANT
SLEEVE Z-THREAD 5X100MM (TROCAR) ×3 IMPLANT
SUT MNCRL AB 4-0 PS2 18 (SUTURE) IMPLANT
SUT VICRYL 0 UR6 27IN ABS (SUTURE) ×3 IMPLANT
SYSTEM BAG RETRIEVAL 10MM (BASKET) IMPLANT
SYSTEM CARTER THOMASON II (TROCAR) IMPLANT
SYSTEM RETRIEVL 5MM INZII UNIV (BASKET) IMPLANT
TOWEL GREEN STERILE FF (TOWEL DISPOSABLE) ×3 IMPLANT
TRAP SPECIMEN MUCUS 40CC (MISCELLANEOUS) IMPLANT
TRAY FOLEY W/BAG SLVR 14FR (SET/KITS/TRAYS/PACK) ×3 IMPLANT
TROCAR 11X100 Z THREAD (TROCAR) IMPLANT
TROCAR Z-THREAD OPTICAL 5X100M (TROCAR) ×3 IMPLANT
WARMER LAPAROSCOPE (MISCELLANEOUS) ×3 IMPLANT

## 2023-12-08 NOTE — Anesthesia Procedure Notes (Signed)
 Procedure Name: Intubation Date/Time: 12/08/2023 10:12 AM  Performed by: Linard Reno, CRNAPre-anesthesia Checklist: Patient identified, Emergency Drugs available, Suction available and Patient being monitored Patient Re-evaluated:Patient Re-evaluated prior to induction Oxygen Delivery Method: Circle System Utilized Preoxygenation: Pre-oxygenation with 100% oxygen Induction Type: IV induction Ventilation: Mask ventilation without difficulty Laryngoscope Size: Mac and 3 Grade View: Grade I Tube type: Oral Tube size: 7.0 mm Number of attempts: 1 Airway Equipment and Method: Stylet and Oral airway Placement Confirmation: ETT inserted through vocal cords under direct vision, positive ETCO2 and breath sounds checked- equal and bilateral Secured at: 21 cm Tube secured with: Tape Dental Injury: Teeth and Oropharynx as per pre-operative assessment

## 2023-12-08 NOTE — Interval H&P Note (Signed)
 History and Physical Interval Note:  12/08/2023 8:03 AM  Danielle Olsen  has presented today for surgery, with the diagnosis of left cyst ovarian.  The various methods of treatment have been discussed with the patient and family. After consideration of risks, benefits and other options for treatment, the patient has consented to  Procedure(s) with comments: OOPHORECTOMY, LAPAROSCOPIC (Left) LAPAROTOMY (N/A) - mini lap LAPAROSCOPY OPERATIVE (N/A) - pelvic washing as a surgical intervention.  The patient's history has been reviewed, patient examined, no change in status, stable for surgery.  I have reviewed the patient's chart and labs.  Questions were answered to the patient's satisfaction.    Patient is additionally consented for possible salpingo-oophorectomy and possible mini-lap incision. Consent form amended and signed by all parties  Martin Slay Jentri Aye

## 2023-12-08 NOTE — Transfer of Care (Signed)
 Immediate Anesthesia Transfer of Care Note  Patient: Danielle Olsen  Procedure(s) Performed: OOPHORECTOMY, LAPAROSCOPIC (Left) LAPAROTOMY LAPAROSCOPY OPERATIVE  Patient Location: PACU  Anesthesia Type:General  Level of Consciousness: awake, alert , oriented, and patient cooperative  Airway & Oxygen Therapy: Patient Spontanous Breathing and Patient connected to nasal cannula oxygen  Post-op Assessment: Report given to RN and Post -op Vital signs reviewed and stable  Post vital signs: Reviewed and stable  Last Vitals:  Vitals Value Taken Time  BP 128/84 12/08/23 1215  Temp    Pulse 87 12/08/23 1217  Resp 8 12/08/23 1217  SpO2 96 % 12/08/23 1217  Vitals shown include unfiled device data.  Last Pain:  Vitals:   12/08/23 0808  TempSrc:   PainSc: 0-No pain         Complications: No notable events documented.

## 2023-12-08 NOTE — Discharge Instructions (Signed)
 DO NOT TAKE MOTRIN  UNTIL AFTER 5:45PM DO NOT TAKE TYLENOL  UNTIL AFTER 2:15 PM   Post Anesthesia Home Care Instructions  Activity: Get plenty of rest for the remainder of the day. A responsible adult should stay with you for 24 hours following the procedure.  For the next 24 hours, DO NOT: -Drive a car -Advertising copywriter -Drink alcoholic beverages -Take any medication unless instructed by your physician -Make any legal decisions or sign important papers.  Meals: Start with liquid foods such as gelatin or soup. Progress to regular foods as tolerated. Avoid greasy, spicy, heavy foods. If nausea and/or vomiting occur, drink only clear liquids until the nausea and/or vomiting subsides. Call your physician if vomiting continues.  Special Instructions/Symptoms: Your throat may feel dry or sore from the anesthesia or the breathing tube placed in your throat during surgery. If this causes discomfort, gargle with warm salt water. The discomfort should disappear within 24 hours.  If you had a scopolamine patch placed behind your ear for the management of post- operative nausea and/or vomiting:  1. The medication in the patch is effective for 72 hours, after which it should be removed.  Wrap patch in a tissue and discard in the trash. Wash hands thoroughly with soap and water. 2. You may remove the patch earlier than 72 hours if you experience unpleasant side effects which may include dry mouth, dizziness or visual disturbances. 3. Avoid touching the patch. Wash your hands with soap and water after contact with the patch.  Call your surgeon if you experience:   1.  Fever over 101.0. 2.  Inability to urinate. 3.  Nausea and/or vomiting. 4.  Extreme swelling or bruising at the surgical site. 5.  Continued bleeding from the incision. 6.  Increased pain, redness or drainage from the incision. 7.  Problems related to your pain medication. 8. Any change in color, movement and/or sensation 9. Any  problems and/or concerns

## 2023-12-08 NOTE — Op Note (Signed)
 Date: 12/08/23  Preoperative Diagnosis: Symptomatic left ovarian cyst Postoperative Diagnosis: Multicystic left ovary Procedure: 1) Pelvic washings 2) Laparoscopic left salpingo-oophorectomy 3) Lysis of adhesions Surgeon: Ethridge Herder, MD Assist: Sandra Crouch, DO An experienced assistant was required given the standard of surgical care given the complexity of the case and maternal body habitus.  This assistant was needed for exposure, dissection, suctioning, retraction, instrument exchange, assisting with delivery with administration of fundal pressure, and for overall help during the procedure.   Anesthesia: GETA by Dr Harwood Lingo Findings: On external exam, genitalia WNl. Preoperative BME reveals anteverted uterus with palpable mass at 14wks c/w known left adnexal cyst Specimens: pelvic washings, left ovary and fallopian tube - all to pathology EBL <100cc IVF 1L UOP 100cc  Consent:  Risks of procedure include infection of the uterus, pelvic organs, or skin, inadvertent injury to internal organs, such as bowel or bladder. If there is major injury, extensive surgery may be required. If injury is minor, it may be treated with relative ease. Discussed possibility of excessive blood loss and transfusion. Patient accepts possibility of conversion to open procedure. Patient accepts the possibility of blood transfusion, if necessary. Bowel and/or bladder injury may require prolonged inpatient stay and possible colostomy, Foley catheter, etc, as deemed fit by other surgeon. Patient understands and agrees to move forward with surgery.   Operative Procedure Patient was taken to the operating room where general anesthesia was obtained without difficulty. She was then prepped and draped in the normal sterile fashion in the dorsal lithotomy position. An appropriate timeout was performed. A speculum was then placed within the vagina and a Sponge stick placed within the vagina for uterine manipulation. The  bladder was emptied via Foley. Attention was then turned to the patient's abdomen after draping where the infraumbilical area was injected with approximately 10 cc of quarter percent Marcaine . A 1 cm incision was then made within the umbilicus and a 5mm trochar was then inserted under direct visualization. Gas flow was then applied and a pneumoperitoneum obtained with approximate 3 L of CO2 gas. With patient in Trendelenburg the uterus and tubes and ovaries were inspected with findings as previously stated. 2 additional ports were placed in LLQ (11mm) and RLQ (5mm) by palpating 2cm above and 2cm medial to corresponding ASIS, transilluminating during Marcaine  injeciton and port placement.  Upon introduction of laparoscopic scope, the following was noted: Normal-appearing uterus with approximately 1 cm subserosal fibroid at right aspect of the fundus; overall normal-appearing right fallopian tube and right ovary with functional small cyst on inferior aspect.left adnexal mass was visualized with an overall smooth capsule and evidence of multicystic appearance.  No overlying implants on serosa.  Filmy adhesions noted to left pelvic sidewall and descending colon.  Left fallopian tube completely adherent to the cystic mass with only fimbriated ends being minimally free as a marker of where tubal structure ends.  A bipolar cautery was then introduced through the LLQ port.  Using atraumatic graspers to elevate specimen off of the pelvic sidewall, harmonic scalpel was used to take down filmy adhesions and thin capsule thereby freeing bowel from inferior portion of cystic mass.  Bipolar device was then introduced through RLQ port.  Harmonic was used to transect left fallopian tube and uterine cornu and systematically take down the utero-ovarian ligament.  Harmonic then sequentially used 2 separate mass from pelvic sidewall taking care to displace specimen away from sidewall to avoid significant structures.  IP ligament was  then coagulated and cut using harmonic  scalpel.  Bipolar cautery used for spot hemostasis.  A 10 mm Endo Catch bag was then introduced through the left lower quadrant port.  Specimen was then placed within bag in totality.  Cystic component was ruptured in bag care taking care to avoid any spill of content.  Content appeared clear and yellow-green in nature without any particulate matter or bleeding.  No evidence of mucinous debris.  Suction irrigation was used to deflate cystic component until the specimen was easily mobilized through 11 mm port.  At this point, port was removed and Endo Catch bag was extruded through incision.  Specimen then removed piece wise through port and Endo Catch bag was removed intact through incision site.  1 mm port replaced without issue.  Laparoscopic Arista used on overlying dissection sites with excellent hemostasis.  The remainder of the pelvis and abdomen were inspected and found to be normal. The instruments were removed from the abdomen after using a Carter-Thomason device to close left lower quadrant port using 0 Vicryl on cut UR 6 needle.  The pneumoperitoneum was reduced through the trocar. The trocars were finally removed and all incisions closed with a subcuticular stitch of 4-0 Vicryl. Dermabond was placed.  Sponge stick was removed. Patient was then awakened and taken to the recovery room in good condition.

## 2023-12-08 NOTE — Anesthesia Postprocedure Evaluation (Signed)
 Anesthesia Post Note  Patient: Danielle Olsen  Procedure(s) Performed: OOPHORECTOMY, LAPAROSCOPIC (Left) LAPAROTOMY LAPAROSCOPY OPERATIVE     Patient location during evaluation: PACU Anesthesia Type: General Level of consciousness: awake and alert Pain management: pain level controlled Vital Signs Assessment: post-procedure vital signs reviewed and stable Respiratory status: spontaneous breathing, nonlabored ventilation and respiratory function stable Cardiovascular status: blood pressure returned to baseline and stable Postop Assessment: no apparent nausea or vomiting Anesthetic complications: no  No notable events documented.  Last Vitals:  Vitals:   12/08/23 0752 12/08/23 1215  BP: (!) 141/84 128/84  Pulse: 89 90  Resp: 18 10  Temp: 37.2 C 36.6 C  SpO2: 96% 98%    Last Pain:  Vitals:   12/08/23 1243  TempSrc:   PainSc: 5                  Alpha Chouinard,W. EDMOND

## 2023-12-08 NOTE — Anesthesia Preprocedure Evaluation (Addendum)
 Anesthesia Evaluation  Patient identified by MRN, date of birth, ID band Patient awake    Reviewed: Allergy & Precautions, H&P , NPO status , Patient's Chart, lab work & pertinent test results  History of Anesthesia Complications (+) PONV and history of anesthetic complications  Airway Mallampati: II  TM Distance: >3 FB Neck ROM: Full    Dental no notable dental hx. (+) Teeth Intact, Dental Advisory Given   Pulmonary asthma    Pulmonary exam normal breath sounds clear to auscultation       Cardiovascular negative cardio ROS  Rhythm:Regular Rate:Normal     Neuro/Psych negative neurological ROS  negative psych ROS   GI/Hepatic negative GI ROS, Neg liver ROS,,,  Endo/Other  negative endocrine ROS    Renal/GU negative Renal ROS  negative genitourinary   Musculoskeletal   Abdominal   Peds  Hematology negative hematology ROS (+)   Anesthesia Other Findings   Reproductive/Obstetrics negative OB ROS                             Anesthesia Physical Anesthesia Plan  ASA: 2  Anesthesia Plan: General   Post-op Pain Management: Tylenol  PO (pre-op)* and Toradol  IV (intra-op)*   Induction: Intravenous  PONV Risk Score and Plan: 4 or greater and Ondansetron , Dexamethasone, Propofol infusion, TIVA, Midazolam and Scopolamine patch - Pre-op  Airway Management Planned: Oral ETT  Additional Equipment:   Intra-op Plan:   Post-operative Plan: Extubation in OR  Informed Consent: I have reviewed the patients History and Physical, chart, labs and discussed the procedure including the risks, benefits and alternatives for the proposed anesthesia with the patient or authorized representative who has indicated his/her understanding and acceptance.     Dental advisory given  Plan Discussed with: CRNA  Anesthesia Plan Comments:        Anesthesia Quick Evaluation

## 2023-12-09 ENCOUNTER — Encounter (HOSPITAL_COMMUNITY): Payer: Self-pay | Admitting: Obstetrics and Gynecology

## 2023-12-09 LAB — SURGICAL PATHOLOGY

## 2023-12-09 LAB — CYTOLOGY - NON PAP

## 2023-12-10 ENCOUNTER — Other Ambulatory Visit (HOSPITAL_BASED_OUTPATIENT_CLINIC_OR_DEPARTMENT_OTHER): Payer: Self-pay

## 2023-12-22 ENCOUNTER — Other Ambulatory Visit (HOSPITAL_BASED_OUTPATIENT_CLINIC_OR_DEPARTMENT_OTHER): Payer: Self-pay
